# Patient Record
Sex: Female | Born: 1985 | Hispanic: No | Marital: Married | Smoking: Never smoker
Health system: Southern US, Community
[De-identification: ages and names within clinical notes are randomized; demographics above are authoritative.]

## PROBLEM LIST (undated history)

## (undated) DIAGNOSIS — Z9889 Other specified postprocedural states: Secondary | ICD-10-CM

## (undated) DIAGNOSIS — Z3480 Encounter for supervision of other normal pregnancy, unspecified trimester: Secondary | ICD-10-CM

## (undated) HISTORY — PX: NO PAST SURGERIES: SHX2092

---

## 2009-08-29 ENCOUNTER — Inpatient Hospital Stay (HOSPITAL_COMMUNITY): Admission: AD | Admit: 2009-08-29 | Discharge: 2009-09-01 | Payer: Self-pay | Admitting: Obstetrics

## 2010-06-01 LAB — COMPREHENSIVE METABOLIC PANEL
Albumin: 2.9 g/dL — ABNORMAL LOW (ref 3.5–5.2)
Alkaline Phosphatase: 97 U/L (ref 39–117)
BUN: 11 mg/dL (ref 6–23)
Calcium: 9.2 mg/dL (ref 8.4–10.5)
Creatinine, Ser: 0.42 mg/dL (ref 0.4–1.2)
Glucose, Bld: 92 mg/dL (ref 70–99)
Potassium: 4.1 mEq/L (ref 3.5–5.1)
Total Protein: 6.6 g/dL (ref 6.0–8.3)

## 2010-06-01 LAB — CBC
HCT: 21.8 % — ABNORMAL LOW (ref 36.0–46.0)
HCT: 28.5 % — ABNORMAL LOW (ref 36.0–46.0)
Hemoglobin: 7.4 g/dL — ABNORMAL LOW (ref 12.0–15.0)
MCHC: 33.2 g/dL (ref 30.0–36.0)
MCV: 80.9 fL (ref 78.0–100.0)
Platelets: 176 10*3/uL (ref 150–400)
RBC: 2.69 MIL/uL — ABNORMAL LOW (ref 3.87–5.11)
RDW: 17 % — ABNORMAL HIGH (ref 11.5–15.5)
WBC: 19.9 10*3/uL — ABNORMAL HIGH (ref 4.0–10.5)

## 2010-06-01 LAB — RPR: RPR Ser Ql: NONREACTIVE

## 2011-03-17 NOTE — L&D Delivery Note (Signed)
Delivery Note At 10:51 PM a viable female was delivered via Vaginal, Spontaneous Delivery (Presentation: OA; LOT  ).  APGAR: 8,8 ; weight P.   Placenta status:delivered, intact .  Cord:  with the following complications: none .    Anesthesia:  epidural Episiotomy: none Lacerations: 2nd degree perineal Suture Repair: 3.0 vicryl rapide Est. Blood Loss (mL): 500  Mom to postpartum.  Baby to nursery-stable.  BOVARD,Sly Parlee 07/02/2011, 11:13 PM   Br/ O+/ Contra?

## 2011-03-23 LAB — GC/CHLAMYDIA PROBE AMP, GENITAL
Chlamydia: NEGATIVE
Gonorrhea: NEGATIVE

## 2011-03-23 LAB — ABO/RH: RH Type: POSITIVE

## 2011-03-23 LAB — RUBELLA ANTIBODY, IGM: Rubella: IMMUNE

## 2011-04-02 ENCOUNTER — Other Ambulatory Visit: Payer: Self-pay

## 2011-04-03 ENCOUNTER — Other Ambulatory Visit (HOSPITAL_COMMUNITY): Payer: Self-pay | Admitting: Obstetrics and Gynecology

## 2011-04-03 DIAGNOSIS — O0930 Supervision of pregnancy with insufficient antenatal care, unspecified trimester: Secondary | ICD-10-CM

## 2011-04-29 ENCOUNTER — Encounter (HOSPITAL_COMMUNITY): Payer: Self-pay

## 2011-04-29 ENCOUNTER — Ambulatory Visit (HOSPITAL_COMMUNITY)
Admission: RE | Admit: 2011-04-29 | Discharge: 2011-04-29 | Disposition: A | Discharge status: Home / Self Care | Payer: Medicaid Other | Source: Ambulatory Visit | Attending: Obstetrics and Gynecology | Admitting: Obstetrics and Gynecology

## 2011-04-29 DIAGNOSIS — O358XX0 Maternal care for other (suspected) fetal abnormality and damage, not applicable or unspecified: Secondary | ICD-10-CM | POA: Insufficient documentation

## 2011-04-29 DIAGNOSIS — O0930 Supervision of pregnancy with insufficient antenatal care, unspecified trimester: Secondary | ICD-10-CM | POA: Insufficient documentation

## 2011-04-29 DIAGNOSIS — Z363 Encounter for antenatal screening for malformations: Secondary | ICD-10-CM | POA: Insufficient documentation

## 2011-04-29 DIAGNOSIS — Z1389 Encounter for screening for other disorder: Secondary | ICD-10-CM | POA: Insufficient documentation

## 2011-06-01 LAB — STREP B DNA PROBE: GBS: NEGATIVE

## 2011-07-01 ENCOUNTER — Encounter (HOSPITAL_COMMUNITY): Payer: Self-pay

## 2011-07-01 ENCOUNTER — Encounter (HOSPITAL_COMMUNITY): Payer: Self-pay | Admitting: *Deleted

## 2011-07-01 ENCOUNTER — Telehealth (HOSPITAL_COMMUNITY): Payer: Self-pay | Admitting: *Deleted

## 2011-07-01 DIAGNOSIS — Z3480 Encounter for supervision of other normal pregnancy, unspecified trimester: Secondary | ICD-10-CM

## 2011-07-01 HISTORY — DX: Encounter for supervision of other normal pregnancy, unspecified trimester: Z34.80

## 2011-07-01 NOTE — Telephone Encounter (Signed)
Preadmission screen  

## 2011-07-01 NOTE — H&P (Signed)
Natalie [name] is a 26 y.o. female G2P1001 at 33+ for iol given postdates.  +FM, no LOF, no VB, occ ctx; pregnancy complicated by late entry to care.  D/w pt r/b/a of iol Maternal Medical History:  Fetal activity: Perceived fetal activity is normal.      OB History    Grav Para Term Preterm Abortions TAB SAB Ect Mult Living   2 1 1       1     G1 7#1, female 08/2009; G2 present; no abn pap, no STDs Past Medical History  Diagnosis Date  . No pertinent past medical history   . Normal pregnancy, repeat 07/01/2011   Past Surgical History  Procedure Date  . No past surgeries    Family History: denied Social History:  reports that she has never smoked. She has never used smokeless tobacco. She reports that she does not drink alcohol or use illicit drugs.married Meds PNV All NKDA  Review of Systems  Constitutional: Negative.   HENT: Negative.   Eyes: Negative.   Respiratory: Negative.   Cardiovascular: Negative.   Gastrointestinal: Negative.   Genitourinary: Negative.   Musculoskeletal: Negative.   Skin: Negative.   Neurological: Negative.   Endo/Heme/Allergies: Negative.   Psychiatric/Behavioral: Negative.       Last menstrual period 09/21/2010. Maternal Exam:  Uterine Assessment: Contraction strength is moderate.  Contraction frequency is irregular.   Abdomen: Fundal height is appropriate for gestation.   Estimated fetal weight is 6-7#.   Fetal presentation: vertex     Physical Exam  Constitutional: She is oriented to person, place, and time. She appears well-developed and well-nourished.  HENT:  Head: Normocephalic and atraumatic.  Neck: Normal range of motion. Neck supple. No thyromegaly present.  Cardiovascular: Normal rate and regular rhythm.   Respiratory: Effort normal and breath sounds normal.  GI: Soft. Bowel sounds are normal. There is no tenderness.  Musculoskeletal: Normal range of motion.  Neurological: She is alert and oriented to person, place, and  time.  Skin: Skin is warm and dry.  Psychiatric: She has a normal mood and affect. Her behavior is normal.    Prenatal labs: ABO, Rh: O/Positive/-- (01/07 0000) Antibody: Negative (01/07 0000) Rubella: Immune (01/07 0000) RPR: Nonreactive (01/07 0000)  HBsAg: Negative (01/07 0000)  HIV: Non-reactive (01/07 0000)  GBS: Negative (03/18 0000)  Hgb 11.0/ Pap WNL/ Plt 174K/ GC neg/ Chl neg/ CF neg/ glucola 85   [place] good growth, anl anat, somewhat limited, ant plac, female F/u nl anat Assessment/Plan: 26yo G2P1001 for iol given postdates Cytotec/pitocin/ AROM Epidural and IV pain emds Expect SVD   Natalie [name] 07/01/2011, 5:38 PM

## 2011-07-02 ENCOUNTER — Encounter (HOSPITAL_COMMUNITY): Payer: Self-pay | Admitting: Anesthesiology

## 2011-07-02 ENCOUNTER — Encounter (HOSPITAL_COMMUNITY): Payer: Self-pay

## 2011-07-02 ENCOUNTER — Inpatient Hospital Stay (HOSPITAL_COMMUNITY)
Admission: RE | Admit: 2011-07-02 | Discharge: 2011-07-04 | DRG: 775 | Disposition: A | Discharge status: Home / Self Care | Payer: Medicaid Other | Source: Ambulatory Visit | Attending: Obstetrics and Gynecology | Admitting: Obstetrics and Gynecology

## 2011-07-02 ENCOUNTER — Inpatient Hospital Stay (HOSPITAL_COMMUNITY): Payer: Medicaid Other | Admitting: Anesthesiology

## 2011-07-02 VITALS — BP 90/57 | HR 75 | Temp 97.6°F | Resp 18 | Ht 63.0 in | Wt 140.0 lb

## 2011-07-02 DIAGNOSIS — O480 Post-term pregnancy: Principal | ICD-10-CM | POA: Diagnosis present

## 2011-07-02 DIAGNOSIS — Z3480 Encounter for supervision of other normal pregnancy, unspecified trimester: Secondary | ICD-10-CM

## 2011-07-02 HISTORY — DX: Encounter for supervision of other normal pregnancy, unspecified trimester: Z34.80

## 2011-07-02 LAB — CBC
Hemoglobin: 9.7 g/dL — ABNORMAL LOW (ref 12.0–15.0)
MCH: 26.9 pg (ref 26.0–34.0)
MCHC: 31.9 g/dL (ref 30.0–36.0)
MCV: 84.4 fL (ref 78.0–100.0)
Platelets: 179 10*3/uL (ref 150–400)
RBC: 3.6 MIL/uL — ABNORMAL LOW (ref 3.87–5.11)

## 2011-07-02 MED ORDER — LIDOCAINE HCL (PF) 1 % IJ SOLN
30.0000 mL | INTRAMUSCULAR | Status: DC | PRN
  Administered 2011-07-02: 30 mL via SUBCUTANEOUS
  Filled 2011-07-02: qty 30

## 2011-07-02 MED ORDER — LACTATED RINGERS IV SOLN
500.0000 mL | Freq: Once | INTRAVENOUS | Status: AC
  Administered 2011-07-02: 1000 mL via INTRAVENOUS

## 2011-07-02 MED ORDER — IBUPROFEN 600 MG PO TABS: 600.0000 mg | ORAL_TABLET | Freq: Four times a day (QID) | ORAL | Status: DC | PRN

## 2011-07-02 MED ORDER — LIDOCAINE HCL (PF) 1 % IJ SOLN
INTRAMUSCULAR | Status: DC | PRN
  Administered 2011-07-02: 8 mL
  Administered 2011-07-02: 888 mL

## 2011-07-02 MED ORDER — DIPHENHYDRAMINE HCL 50 MG/ML IJ SOLN: 12.5000 mg | INTRAMUSCULAR | Status: DC | PRN

## 2011-07-02 MED ORDER — BUTORPHANOL TARTRATE 2 MG/ML IJ SOLN
2.0000 mg | INTRAMUSCULAR | Status: DC | PRN
  Filled 2011-07-02: qty 1

## 2011-07-02 MED ORDER — FLEET ENEMA 7-19 GM/118ML RE ENEM: 1.0000 | ENEMA | RECTAL | Status: DC | PRN

## 2011-07-02 MED ORDER — ONDANSETRON HCL 4 MG/2ML IJ SOLN: 4.0000 mg | Freq: Four times a day (QID) | INTRAMUSCULAR | Status: DC | PRN

## 2011-07-02 MED ORDER — OXYTOCIN BOLUS FROM INFUSION
500.0000 mL | Freq: Once | INTRAVENOUS | Status: DC
  Filled 2011-07-02: qty 500
  Filled 2011-07-02: qty 1000

## 2011-07-02 MED ORDER — ACETAMINOPHEN 325 MG PO TABS: 650.0000 mg | ORAL_TABLET | ORAL | Status: DC | PRN

## 2011-07-02 MED ORDER — LACTATED RINGERS IV SOLN: 500.0000 mL | INTRAVENOUS | Status: DC | PRN

## 2011-07-02 MED ORDER — LIDOCAINE-EPINEPHRINE 1.5-1:200000 % IJ SOLN
INTRAMUSCULAR | Status: DC | PRN
  Administered 2011-07-02: 10 mL via INTRADERMAL

## 2011-07-02 MED ORDER — TERBUTALINE SULFATE 1 MG/ML IJ SOLN: 0.2500 mg | Freq: Once | INTRAMUSCULAR | Status: DC | PRN

## 2011-07-02 MED ORDER — OXYCODONE-ACETAMINOPHEN 5-325 MG PO TABS: 1.0000 | ORAL_TABLET | ORAL | Status: DC | PRN

## 2011-07-02 MED ORDER — FENTANYL 2.5 MCG/ML BUPIVACAINE 1/10 % EPIDURAL INFUSION (WH - ANES)
INTRAMUSCULAR | Status: DC | PRN
  Administered 2011-07-02: 14 mL/h via EPIDURAL

## 2011-07-02 MED ORDER — FENTANYL 2.5 MCG/ML BUPIVACAINE 1/10 % EPIDURAL INFUSION (WH - ANES)
14.0000 mL/h | INTRAMUSCULAR | Status: DC
  Administered 2011-07-02: 14 mL/h via EPIDURAL
  Filled 2011-07-02 (×2): qty 60

## 2011-07-02 MED ORDER — PHENYLEPHRINE 40 MCG/ML (10ML) SYRINGE FOR IV PUSH (FOR BLOOD PRESSURE SUPPORT)
80.0000 ug | PREFILLED_SYRINGE | INTRAVENOUS | Status: DC | PRN
  Filled 2011-07-02 (×2): qty 5

## 2011-07-02 MED ORDER — LACTATED RINGERS IV SOLN
INTRAVENOUS | Status: DC
  Administered 2011-07-02: 08:00:00 via INTRAVENOUS
  Administered 2011-07-02 (×2): 999 mL/h via INTRAVENOUS

## 2011-07-02 MED ORDER — EPHEDRINE 5 MG/ML INJ: 10.0000 mg | INTRAVENOUS | Status: DC | PRN

## 2011-07-02 MED ORDER — OXYTOCIN 20 UNITS IN LACTATED RINGERS INFUSION - SIMPLE
1.0000 m[IU]/min | INTRAVENOUS | Status: DC
  Administered 2011-07-02: 999 m[IU]/min via INTRAVENOUS
  Administered 2011-07-02: 2 m[IU]/min via INTRAVENOUS

## 2011-07-02 MED ORDER — SODIUM BICARBONATE 8.4 % IV SOLN
INTRAVENOUS | Status: DC | PRN
  Administered 2011-07-02: 5 mL via EPIDURAL

## 2011-07-02 MED ORDER — PHENYLEPHRINE 40 MCG/ML (10ML) SYRINGE FOR IV PUSH (FOR BLOOD PRESSURE SUPPORT): 80.0000 ug | PREFILLED_SYRINGE | INTRAVENOUS | Status: DC | PRN

## 2011-07-02 MED ORDER — MISOPROSTOL 25 MCG QUARTER TABLET
25.0000 ug | ORAL_TABLET | ORAL | Status: DC | PRN
  Administered 2011-07-02: 25 ug via VAGINAL
  Filled 2011-07-02: qty 0.25

## 2011-07-02 MED ORDER — OXYTOCIN 20 UNITS IN LACTATED RINGERS INFUSION - SIMPLE: 125.0000 mL/h | Freq: Once | INTRAVENOUS | Status: DC

## 2011-07-02 MED ORDER — TERBUTALINE SULFATE 1 MG/ML IJ SOLN: 0.2500 mg | Freq: Once | INTRAMUSCULAR | Status: AC | PRN

## 2011-07-02 MED ORDER — EPHEDRINE 5 MG/ML INJ
10.0000 mg | INTRAVENOUS | Status: DC | PRN
  Administered 2011-07-02: 10 mg via INTRAVENOUS
  Filled 2011-07-02 (×2): qty 4

## 2011-07-02 MED ORDER — CITRIC ACID-SODIUM CITRATE 334-500 MG/5ML PO SOLN: 30.0000 mL | ORAL | Status: DC | PRN

## 2011-07-02 NOTE — Progress Notes (Signed)
Traniece Boffa is a 26 y.o. G2P1001 at [redacted]w[redacted]d  admitted for induction of labor due to Post dates. Due date 06/28/11.  Subjective: No c/o's, +FM, no LOF, no VB, occ ctx  Objective: BP 105/68  Pulse 75  Temp 98.3 F (36.8 C)  Resp 18  Ht 5\' 3"  (1.6 m)  Wt 63.504 kg (140 lb)  BMI 24.80 kg/m2  LMP 09/21/2010      FHT:  FHR: 140 bpm, variability: moderate,  accelerations:  Present,  decelerations:  Absent UC:   regular, every 2-4 minutes SVE:   Dilation: Fingertip Effacement (%): 50 Station: -2 Exam by:: apannell  Labs: Lab Results  Component Value Date   WBC 13.0* 07/02/2011   HGB 9.7* 07/02/2011   HCT 30.4* 07/02/2011   MCV 84.4 07/02/2011   PLT 179 07/02/2011    Assessment / Plan: Induction of labor due to postterm,  Ripening with cytotec  Labor: Progressing normally Preeclampsia:  no signs or symptoms of toxicity Fetal Wellbeing:  Category I Pain Control:  Epidural and IV pain meds prn I/D:  n/a Anticipated MOD:  NSVD  BOVARD,Inna Tisdell 07/02/2011, 9:07 AM

## 2011-07-02 NOTE — Progress Notes (Signed)
Natalie [name] is a 26 y.o. G2P1001 at [redacted]w[redacted]d admitted for induction of labor due to Post dates.   Subjective: comf with epidural  Objective: BP 97/48  Pulse 96  Temp(Src) 98.6 F (37 C) (Oral)  Resp 18  Ht 5\' 3"  (1.6 m)  Wt 63.504 kg (140 lb)  BMI 24.80 kg/m2  SpO2 100%  LMP 09/21/2010      FHT:  FHR: 150 bpm, variability: moderate,  accelerations:  Present,  decelerations:  Absent UC:   regular, every 2-3 minutes SVE:   Dilation: 3 Effacement (%): 50 Station: -2 Exam by:: Dr. Ellyn Hack  Labs: Lab Results  Component Value Date   WBC 13.0* 07/02/2011   HGB 9.7* 07/02/2011   HCT 30.4* 07/02/2011   MCV 84.4 07/02/2011   PLT 179 07/02/2011    Assessment / Plan: Induction of labor due to postterm,  progressing well on pitocin  Labor: Progressing on Pitocin, will continue to increase.  AROM performed without difficulty or complication for clear fluid Preeclampsia:  no signs or symptoms of toxicity Fetal Wellbeing:  Category I Pain Control:  Epidural I/D:  n/a Anticipated MOD:  NSVD  BOVARD,Airen Dales 07/02/2011, 5:32 PM

## 2011-07-02 NOTE — Anesthesia Procedure Notes (Addendum)
Epidural Patient location during procedure: OB Start time: 07/02/2011 4:37 PM End time: 07/02/2011 4:42 PM Reason for block: procedure for pain  Staffing Anesthesiologist: Sandrea Hughs  Preanesthetic Checklist Completed: patient identified, site marked, surgical consent, pre-op evaluation, timeout performed, IV checked, risks and benefits discussed and monitors and equipment checked  Epidural Patient position: sitting Prep: site prepped and draped and DuraPrep Patient monitoring: continuous pulse ox and blood pressure Approach: midline Injection technique: LOR air  Needle:  Needle type: Tuohy  Needle gauge: 17 G Needle length: 9 cm Needle insertion depth: 5 cm cm Catheter type: closed end flexible Catheter size: 19 Gauge Catheter at skin depth: 10 cm Test dose: negative and Other  Assessment Sensory level: T8 Events: blood not aspirated, injection not painful, no injection resistance, negative IV test and no paresthesia  Epidural Patient location during procedure: OB  Preanesthetic Checklist Completed: patient identified, site marked, surgical consent, pre-op evaluation, timeout performed, IV checked, risks and benefits discussed and monitors and equipment checked  Epidural Patient position: sitting Prep: site prepped and draped and DuraPrep Patient monitoring: continuous pulse ox and blood pressure Approach: midline Injection technique: LOR air  Needle:  Needle type: Tuohy  Needle gauge: 17 G Needle length: 9 cm Needle insertion depth: 5 cm cm Catheter type: closed end flexible Catheter size: 19 Gauge Catheter at skin depth: 10 cm Test dose: negative  Assessment Events: blood not aspirated, injection not painful, no injection resistance, negative IV test and no paresthesia  Additional Notes Dosing of Epidural:  1st dose, through needle ............................................Marland Kitchen epi 1:200K + Xylocaine 40 mg  2nd dose, through catheter,  after waiting 3 minutes...Marland KitchenMarland Kitchenepi 1:200K + Xylocaine 60 mg    ( mg Marcaine are expressed as equivilent  cc's medication removed from the 0.1%Bupiv / fentanyl syringe from L&D pump)  ( 2% Xylo charted as a single dose in Epic Meds for ease of charting; actual dosing was fractionated as above, for saftey's sake)  As each dose occurred, patient was free of IV sx; and patient exhibited no evidence of SA injection.  Patient is more comfortable after epidural dosed. Please see RN's note for documentation of vital signs,and FHR which are stable.

## 2011-07-02 NOTE — Anesthesia Preprocedure Evaluation (Signed)
Anesthesia Evaluation  Patient identified by MRN, date of birth, ID band Patient awake    Reviewed: Allergy & Precautions, H&P , NPO status , Patient's Chart, lab work & pertinent test results  Airway Mallampati: I TM Distance: >3 FB Neck ROM: full    Dental No notable dental hx.    Pulmonary neg pulmonary ROS,  breath sounds clear to auscultation  Pulmonary exam normal       Cardiovascular negative cardio ROS      Neuro/Psych negative neurological ROS  negative psych ROS   GI/Hepatic negative GI ROS, Neg liver ROS,   Endo/Other  negative endocrine ROS  Renal/GU negative Renal ROS  negative genitourinary   Musculoskeletal negative musculoskeletal ROS (+)   Abdominal Normal abdominal exam  (+)   Peds negative pediatric ROS (+)  Hematology negative hematology ROS (+)   Anesthesia Other Findings   Reproductive/Obstetrics (+) Pregnancy                           Anesthesia Physical Anesthesia Plan  ASA: II  Anesthesia Plan: Epidural   Post-op Pain Management:    Induction:   Airway Management Planned:   Additional Equipment:   Intra-op Plan:   Post-operative Plan:   Informed Consent: I have reviewed the patients History and Physical, chart, labs and discussed the procedure including the risks, benefits and alternatives for the proposed anesthesia with the patient or authorized representative who has indicated his/her understanding and acceptance.     Plan Discussed with:   Anesthesia Plan Comments:         Anesthesia Quick Evaluation  

## 2011-07-03 ENCOUNTER — Encounter (HOSPITAL_COMMUNITY): Payer: Self-pay

## 2011-07-03 LAB — CBC
HCT: 27.5 % — ABNORMAL LOW (ref 36.0–46.0)
Hemoglobin: 8.7 g/dL — ABNORMAL LOW (ref 12.0–15.0)
MCH: 26.5 pg (ref 26.0–34.0)
MCHC: 31.6 g/dL (ref 30.0–36.0)
RDW: 15.9 % — ABNORMAL HIGH (ref 11.5–15.5)

## 2011-07-03 LAB — ABO/RH: ABO/RH(D): O POS

## 2011-07-03 MED ORDER — SIMETHICONE 80 MG PO CHEW: 80.0000 mg | CHEWABLE_TABLET | ORAL | Status: DC | PRN

## 2011-07-03 MED ORDER — TETANUS-DIPHTH-ACELL PERTUSSIS 5-2.5-18.5 LF-MCG/0.5 IM SUSP: 0.5000 mL | Freq: Once | INTRAMUSCULAR | Status: DC

## 2011-07-03 MED ORDER — WITCH HAZEL-GLYCERIN EX PADS: 1.0000 "application " | MEDICATED_PAD | CUTANEOUS | Status: DC | PRN

## 2011-07-03 MED ORDER — ZOLPIDEM TARTRATE 5 MG PO TABS: 5.0000 mg | ORAL_TABLET | Freq: Every evening | ORAL | Status: DC | PRN

## 2011-07-03 MED ORDER — ONDANSETRON HCL 4 MG PO TABS: 4.0000 mg | ORAL_TABLET | ORAL | Status: DC | PRN

## 2011-07-03 MED ORDER — LANOLIN HYDROUS EX OINT: TOPICAL_OINTMENT | CUTANEOUS | Status: DC | PRN

## 2011-07-03 MED ORDER — PRENATAL MULTIVITAMIN CH
1.0000 | ORAL_TABLET | Freq: Every day | ORAL | Status: DC
  Administered 2011-07-03 – 2011-07-04 (×2): 1 via ORAL
  Filled 2011-07-03 (×2): qty 1

## 2011-07-03 MED ORDER — IBUPROFEN 600 MG PO TABS
600.0000 mg | ORAL_TABLET | Freq: Four times a day (QID) | ORAL | Status: DC
  Administered 2011-07-03 – 2011-07-04 (×6): 600 mg via ORAL
  Filled 2011-07-03 (×6): qty 1

## 2011-07-03 MED ORDER — DIBUCAINE 1 % RE OINT: 1.0000 "application " | TOPICAL_OINTMENT | RECTAL | Status: DC | PRN

## 2011-07-03 MED ORDER — ONDANSETRON HCL 4 MG/2ML IJ SOLN: 4.0000 mg | INTRAMUSCULAR | Status: DC | PRN

## 2011-07-03 MED ORDER — BENZOCAINE-MENTHOL 20-0.5 % EX AERO
1.0000 "application " | INHALATION_SPRAY | CUTANEOUS | Status: DC | PRN
  Administered 2011-07-03: 1 via TOPICAL

## 2011-07-03 MED ORDER — OXYCODONE-ACETAMINOPHEN 5-325 MG PO TABS
1.0000 | ORAL_TABLET | ORAL | Status: DC | PRN
  Administered 2011-07-04: 1 via ORAL
  Filled 2011-07-03: qty 1

## 2011-07-03 MED ORDER — BENZOCAINE-MENTHOL 20-0.5 % EX AERO
INHALATION_SPRAY | CUTANEOUS | Status: AC
  Administered 2011-07-03: 1 via TOPICAL
  Filled 2011-07-03: qty 56

## 2011-07-03 MED ORDER — LACTATED RINGERS IV SOLN: INTRAVENOUS | Status: DC

## 2011-07-03 MED ORDER — DIPHENHYDRAMINE HCL 25 MG PO CAPS: 25.0000 mg | ORAL_CAPSULE | Freq: Four times a day (QID) | ORAL | Status: DC | PRN

## 2011-07-03 MED ORDER — SENNOSIDES-DOCUSATE SODIUM 8.6-50 MG PO TABS
2.0000 | ORAL_TABLET | Freq: Every day | ORAL | Status: DC
  Administered 2011-07-03: 2 via ORAL

## 2011-07-03 NOTE — Progress Notes (Signed)
UR chart review completed.  

## 2011-07-03 NOTE — Anesthesia Postprocedure Evaluation (Signed)
  Anesthesia Post-op Note  Patient: Natalie [name]  Procedure(s) Performed: * No procedures listed *  Patient Location: PACU and Mother/Baby  Anesthesia Type: Epidural  Level of Consciousness: awake, alert  and oriented  Airway and Oxygen Therapy: Patient Spontanous Breathing  Post-op Pain: mild  Post-op Assessment: Post-op Vital signs reviewed  Post-op Vital Signs: Reviewed and stable  Complications: No apparent anesthesia complications

## 2011-07-03 NOTE — Progress Notes (Signed)
Post Partum Day 1 Subjective: up ad lib, tolerating PO and nl lochia, pain controlled  Objective: Blood pressure 104/65, pulse 75, temperature 97.6 F (36.4 C), temperature source Oral, resp. rate 16, height 5\' 3"  (1.6 m), weight 63.504 kg (140 lb), last menstrual period 09/21/2010, SpO2 98.00%, unknown if currently breastfeeding.  Physical Exam:  General: alert and no distress Lochia: appropriate Uterine Fundus: firm   Basename 07/03/11 0550 07/02/11 0815  HGB 8.7* 9.7*  HCT 27.5* 30.4*    Assessment/Plan: Plan for discharge tomorrow   LOS: 1 day   Natalie,Natalie Natalie 07/03/2011, 8:39 AM

## 2011-07-04 ENCOUNTER — Encounter (HOSPITAL_COMMUNITY): Payer: Self-pay

## 2011-07-04 MED ORDER — IBUPROFEN 600 MG PO TABS: 600.0000 mg | ORAL_TABLET | Freq: Four times a day (QID) | ORAL | Status: AC

## 2011-07-04 MED ORDER — OXYCODONE-ACETAMINOPHEN 5-325 MG PO TABS: 1.0000 | ORAL_TABLET | ORAL | Status: AC | PRN

## 2011-07-04 NOTE — Discharge Summary (Signed)
Obstetric Discharge Summary Reason for Admission: induction of labor Prenatal Procedures: none Intrapartum Procedures: spontaneous vaginal delivery Postpartum Procedures: none Complications-Operative and Postpartum: second degree perineal laceration Hemoglobin  Date Value Range Status  07/03/2011 8.7* 12.0-15.0 (g/dL) Final     HCT  Date Value Range Status  07/03/2011 27.5* 36.0-46.0 (%) Final    Physical Exam:  General: alert Lochia: appropriate Uterine Fundus: firm    Discharge Diagnoses: Term Pregnancy-delivered  Discharge Information: Date: 07/04/2011 Activity: pelvic rest Diet: routine Medications: Ibuprofen and Percocet Condition: improved Instructions: refer to practice specific booklet Discharge to: home Follow-up Information    Follow up with BOVARD,JODY, MD. Schedule an appointment as soon as possible for a visit in 6 weeks.   Contact information:   [address] [place] [address] [phone]          Newborn Data: Live born female  Birth Weight: 8 lb 4.6 oz (3760 g) APGAR: 8, 8  Home with mother.  Oliver Pila 07/04/2011, 10:51 AM

## 2011-07-04 NOTE — Progress Notes (Signed)
Post Partum Day 2  Subjective: no complaints, up ad lib and tolerating PO  Objective: Blood pressure 90/57, pulse 75, temperature 97.6 F (36.4 C), temperature source Oral, resp. rate 18, height 5\' 3"  (1.6 m), weight 63.504 kg (140 lb), last menstrual period 09/21/2010, SpO2 98.00%, unknown if currently breastfeeding.  Physical Exam:  General: alert Lochia: appropriate Uterine Fundus: firm    Basename 07/03/11 0550 07/02/11 0815  HGB 8.7* 9.7*  HCT 27.5* 30.4*    Assessment/Plan: Discharge home Motrin, percocet F/u 6 weeks in office    LOS: 2 days   Anjanette Gilkey W 07/04/2011, 10:46 AM

## 2014-01-15 ENCOUNTER — Encounter (HOSPITAL_COMMUNITY): Payer: Self-pay

## 2015-01-03 NOTE — Patient Instructions (Signed)
Your procedure is scheduled on:  Monday, January 07, 2015  [facility]: 11:00 A.M.  Pick up the phone at the desk and dial [phone].  Call this number if you have problems the morning of surgery: [phone].  Remember: Do NOT eat food:  After midnight Sunday Do NOT drink clear liquids after:  8:30 am morning of surgery Take these medicines the morning of surgery with a SIP OF WATER: none  Do NOT wear jewelry (body piercing), metal hair clips/bobby pins, make-up, or nail polish. Do NOT wear lotions, powders, or perfumes.  You may wear deoderant. Do NOT shave for 48 hours prior to surgery. Do NOT bring valuables to the hospital. Contacts, dentures, or bridgework may not be worn into surgery. Have a responsible adult drive you home and stay with you for 24 hours after your procedure

## 2015-01-04 ENCOUNTER — Encounter (HOSPITAL_COMMUNITY)
Admission: RE | Admit: 2015-01-04 | Discharge: 2015-01-04 | Disposition: A | Discharge status: Home / Self Care | Payer: Managed Care, Other (non HMO) | Source: Ambulatory Visit | Attending: Obstetrics and Gynecology | Admitting: Obstetrics and Gynecology

## 2015-01-04 ENCOUNTER — Encounter (HOSPITAL_COMMUNITY): Payer: Self-pay

## 2015-01-04 DIAGNOSIS — T8339XA Other mechanical complication of intrauterine contraceptive device, initial encounter: Secondary | ICD-10-CM | POA: Diagnosis present

## 2015-01-04 LAB — CBC
HEMATOCRIT: 33.5 % — AB (ref 36.0–46.0)
HEMOGLOBIN: 11.2 g/dL — AB (ref 12.0–15.0)
MCH: 30.1 pg (ref 26.0–34.0)
MCHC: 33.4 g/dL (ref 30.0–36.0)
MCV: 90.1 fL (ref 78.0–100.0)
Platelets: 189 10*3/uL (ref 150–400)
RBC: 3.72 MIL/uL — AB (ref 3.87–5.11)
RDW: 12.8 % (ref 11.5–15.5)
WBC: 7.4 10*3/uL (ref 4.0–10.5)

## 2015-01-06 NOTE — H&P (Signed)
Natalie [name] is an 29 y.o. female G2P2002 with UD placed 08/2011.  Attempted removal in office 2016.  IUD is embedded in uterine wall, going to OR to remove. D/W pt and husband r/b/a of removal and hysteroscopy.    Pertinent Gynecological History: Sexually transmitted diseases: no past history Previous GYN Procedures: N/A  Last pap: normal OB History: G2, P2002 S/p SVD x 2, Female x 2; 7#1-8#   Menstrual History No LMP recorded.    Past Medical History  Diagnosis Date  . No pertinent past medical history   . Normal pregnancy, repeat 07/01/2011  . SVD (spontaneous vaginal delivery) 07/02/2011    Past Surgical History  Procedure Laterality Date  . 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   HENT: Negative.   Eyes: Negative.   Respiratory: Negative.   Cardiovascular: Negative.   Gastrointestinal: Negative.   Genitourinary: Negative.   Musculoskeletal: Negative.   Skin: Negative.   Neurological: Negative.   Psychiatric/Behavioral: Negative.     unknown if currently breastfeeding. Physical Exam  Constitutional: She is oriented to person, place, and time. She appears well-developed and well-nourished.  HENT:  Head: Normocephalic and atraumatic.  Cardiovascular: Normal rate and regular rhythm.   Respiratory: Effort normal and breath sounds normal. No respiratory distress. She has no wheezes.  GI: Soft. Bowel sounds are normal. She exhibits no distension. There is no tenderness.  Musculoskeletal: Normal range of motion.  Neurological: She is alert and oriented to person, place, and time.  Skin: Skin is warm and dry.  Psychiatric: She has a normal mood and affect. Her behavior is normal.   Assessment/Plan: 29yo [id number] for Mirena removal D/w pt r/b/a of hysteroscopy/IUD removal Wants to conceive in  6-8weeks  Bovard-Stuckert, Annleigh Knueppel 01/06/2015, 8:54 PM

## 2015-01-07 ENCOUNTER — Ambulatory Visit (HOSPITAL_COMMUNITY): Payer: Managed Care, Other (non HMO) | Admitting: Anesthesiology

## 2015-01-07 ENCOUNTER — Encounter (HOSPITAL_COMMUNITY)
Admission: RE | Disposition: A | Discharge status: Home / Self Care | Payer: Self-pay | Source: Ambulatory Visit | Attending: Obstetrics and Gynecology

## 2015-01-07 ENCOUNTER — Ambulatory Visit (HOSPITAL_COMMUNITY)
Admission: RE | Admit: 2015-01-07 | Discharge: 2015-01-07 | Disposition: A | Discharge status: Home / Self Care | Payer: Managed Care, Other (non HMO) | Source: Ambulatory Visit | Attending: Obstetrics and Gynecology | Admitting: Obstetrics and Gynecology

## 2015-01-07 DIAGNOSIS — T8339XA Other mechanical complication of intrauterine contraceptive device, initial encounter: Secondary | ICD-10-CM | POA: Diagnosis not present

## 2015-01-07 HISTORY — PX: HYSTEROSCOPY: SHX211

## 2015-01-07 LAB — PREGNANCY, URINE: Preg Test, Ur: NEGATIVE

## 2015-01-07 SURGERY — HYSTEROSCOPY
Anesthesia: General

## 2015-01-07 MED ORDER — IBUPROFEN 800 MG PO TABS: 800.0000 mg | ORAL_TABLET | Freq: Three times a day (TID) | ORAL | Status: DC | PRN

## 2015-01-07 MED ORDER — LACTATED RINGERS IV SOLN: INTRAVENOUS | Status: DC

## 2015-01-07 MED ORDER — ONDANSETRON HCL 4 MG/2ML IJ SOLN
INTRAMUSCULAR | Status: AC
  Filled 2015-01-07: qty 2

## 2015-01-07 MED ORDER — NEOSTIGMINE METHYLSULFATE 10 MG/10ML IV SOLN
INTRAVENOUS | Status: AC
  Filled 2015-01-07: qty 1

## 2015-01-07 MED ORDER — LIDOCAINE HCL 1 % IJ SOLN
INTRAMUSCULAR | Status: AC
  Filled 2015-01-07: qty 20

## 2015-01-07 MED ORDER — LIDOCAINE HCL (CARDIAC) 20 MG/ML IV SOLN
INTRAVENOUS | Status: DC | PRN
  Administered 2015-01-07: 35 mg via INTRAVENOUS

## 2015-01-07 MED ORDER — PROPOFOL 10 MG/ML IV BOLUS
INTRAVENOUS | Status: AC
  Filled 2015-01-07: qty 20

## 2015-01-07 MED ORDER — MIDAZOLAM HCL 2 MG/2ML IJ SOLN
INTRAMUSCULAR | Status: AC
  Filled 2015-01-07: qty 2

## 2015-01-07 MED ORDER — GLYCOPYRROLATE 0.2 MG/ML IJ SOLN
INTRAMUSCULAR | Status: AC
  Filled 2015-01-07: qty 2

## 2015-01-07 MED ORDER — KETOROLAC TROMETHAMINE 30 MG/ML IJ SOLN
INTRAMUSCULAR | Status: DC | PRN
  Administered 2015-01-07: 15 mg via INTRAVENOUS

## 2015-01-07 MED ORDER — FENTANYL CITRATE (PF) 100 MCG/2ML IJ SOLN
INTRAMUSCULAR | Status: DC | PRN
  Administered 2015-01-07 (×2): 50 ug via INTRAVENOUS

## 2015-01-07 MED ORDER — SCOPOLAMINE 1 MG/3DAYS TD PT72: 1.0000 | MEDICATED_PATCH | Freq: Once | TRANSDERMAL | Status: DC

## 2015-01-07 MED ORDER — LACTATED RINGERS IV SOLN
INTRAVENOUS | Status: DC
  Administered 2015-01-07 (×2): via INTRAVENOUS

## 2015-01-07 MED ORDER — LIDOCAINE HCL 1 % IJ SOLN
INTRAMUSCULAR | Status: DC | PRN
  Administered 2015-01-07: 10 mL

## 2015-01-07 MED ORDER — LACTATED RINGERS IR SOLN
Status: DC | PRN
  Administered 2015-01-07: 3000 mL

## 2015-01-07 MED ORDER — MIDAZOLAM HCL 2 MG/2ML IJ SOLN
INTRAMUSCULAR | Status: DC | PRN
  Administered 2015-01-07: 1 mg via INTRAVENOUS

## 2015-01-07 MED ORDER — LIDOCAINE HCL (CARDIAC) 20 MG/ML IV SOLN
INTRAVENOUS | Status: AC
  Filled 2015-01-07: qty 5

## 2015-01-07 MED ORDER — DEXAMETHASONE SODIUM PHOSPHATE 4 MG/ML IJ SOLN
INTRAMUSCULAR | Status: DC | PRN
  Administered 2015-01-07: 4 mg via INTRAVENOUS

## 2015-01-07 MED ORDER — DEXAMETHASONE SODIUM PHOSPHATE 4 MG/ML IJ SOLN
INTRAMUSCULAR | Status: AC
  Filled 2015-01-07: qty 1

## 2015-01-07 MED ORDER — ONDANSETRON HCL 4 MG/2ML IJ SOLN
INTRAMUSCULAR | Status: DC | PRN
  Administered 2015-01-07: 4 mg via INTRAVENOUS

## 2015-01-07 MED ORDER — PROPOFOL 10 MG/ML IV BOLUS
INTRAVENOUS | Status: DC | PRN
  Administered 2015-01-07 (×2): 10 mg via INTRAVENOUS
  Administered 2015-01-07: 20 mg via INTRAVENOUS
  Administered 2015-01-07: 10 mg via INTRAVENOUS
  Administered 2015-01-07: 50 mg via INTRAVENOUS
  Administered 2015-01-07 (×6): 10 mg via INTRAVENOUS
  Administered 2015-01-07 (×2): 20 mg via INTRAVENOUS
  Administered 2015-01-07: 10 mg via INTRAVENOUS

## 2015-01-07 MED ORDER — FENTANYL CITRATE (PF) 250 MCG/5ML IJ SOLN
INTRAMUSCULAR | Status: AC
  Filled 2015-01-07: qty 5

## 2015-01-07 MED ORDER — ROCURONIUM BROMIDE 100 MG/10ML IV SOLN
INTRAVENOUS | Status: AC
  Filled 2015-01-07: qty 1

## 2015-01-07 MED ORDER — FENTANYL CITRATE (PF) 100 MCG/2ML IJ SOLN: 25.0000 ug | INTRAMUSCULAR | Status: DC | PRN

## 2015-01-07 MED ORDER — ONDANSETRON HCL 4 MG/2ML IJ SOLN: 4.0000 mg | Freq: Once | INTRAMUSCULAR | Status: DC | PRN

## 2015-01-07 SURGICAL SUPPLY — 21 items
ABLATOR ENDOMETRIAL BIPOLAR (ABLATOR) IMPLANT
CANISTER SUCT 3000ML (MISCELLANEOUS) ×3 IMPLANT
CATH ROBINSON RED A/P 16FR (CATHETERS) ×3 IMPLANT
CLOTH BEACON ORANGE TIMEOUT ST (SAFETY) ×3 IMPLANT
CONTAINER PREFILL 10% NBF 60ML (FORM) IMPLANT
ELECT REM PT RETURN 9FT ADLT (ELECTROSURGICAL)
ELECTRODE REM PT RTRN 9FT ADLT (ELECTROSURGICAL) IMPLANT
GLOVE BIO SURGEON STRL SZ 6.5 (GLOVE) ×2 IMPLANT
GLOVE BIO SURGEONS STRL SZ 6.5 (GLOVE) ×1
GLOVE BIOGEL PI IND STRL 7.0 (GLOVE) ×1 IMPLANT
GLOVE BIOGEL PI INDICATOR 7.0 (GLOVE) ×2
GOWN STRL REUS W/TWL LRG LVL3 (GOWN DISPOSABLE) ×6 IMPLANT
LOOP ANGLED CUTTING 22FR (CUTTING LOOP) IMPLANT
NEEDLE SPNL 22GX3.5 QUINCKE BK (NEEDLE) ×3 IMPLANT
PACK VAGINAL MINOR WOMEN LF (CUSTOM PROCEDURE TRAY) ×3 IMPLANT
PAD OB MATERNITY 4.3X12.25 (PERSONAL CARE ITEMS) ×3 IMPLANT
PAD PREP 24X48 CUFFED NSTRL (MISCELLANEOUS) ×3 IMPLANT
TOWEL OR 17X24 6PK STRL BLUE (TOWEL DISPOSABLE) ×6 IMPLANT
TUBING AQUILEX INFLOW (TUBING) ×3 IMPLANT
TUBING AQUILEX OUTFLOW (TUBING) ×3 IMPLANT
WATER STERILE IRR 1000ML POUR (IV SOLUTION) ×3 IMPLANT

## 2015-01-07 NOTE — Brief Op Note (Signed)
01/07/2015  1:08 PM  PATIENT:  Torra Redcay  29 y.o. female  PRE-OPERATIVE DIAGNOSIS:  Removal of Embedded IUD  POST-OPERATIVE DIAGNOSIS:  Attempted Removal of Embedded IUD, diagnostic hysteroscopy  PROCEDURE:  Procedure(s) with comments: HYSTEROSCOPY DIAGNOSTIC, ATTEMPTED REMOVAL OF iNTRAUTERINE DEVICE (N/A) - MD requests 1hr OR time  SURGEON:  Surgeon(s) and Role:    * Sherian ReinJody Bovard-Stuckert, MD - Primary    * Waynard ReedsKendra Ross, MD - Assisting  ANESTHESIA:   IV sedation  EBL:  Total I/O In: 1200 [I.V.:1200] Out: 70 [Urine:50; Blood:20]  BLOOD ADMINISTERED:none  DRAINS: none   LOCAL MEDICATIONS USED:  LIDOCAINE   SPECIMEN:  No Specimen  DISPOSITION OF SPECIMEN:  N/A  COUNTS:  YES  TOURNIQUET:  * No tourniquets in log *  DICTATION: .Other Dictation: Dictation Number [id number]  PLAN OF CARE: Discharge to home after PACU  PATIENT DISPOSITION:  PACU - hemodynamically stable.   Delay start of Pharmacological VTE agent (>24hrs) due to surgical blood loss or risk of bleeding: not applicable

## 2015-01-07 NOTE — Anesthesia Postprocedure Evaluation (Signed)
  Anesthesia Post-op Note  Patient: Natalie [name]  Procedure(s) Performed: Procedure(s) (LRB): HYSTEROSCOPY DIAGNOSTIC, ATTEMPTED REMOVAL OF iNTRAUTERINE DEVICE (N/A)  Patient Location: PACU  Anesthesia Type: MAC  Level of Consciousness: awake and alert   Airway and Oxygen Therapy: Patient Spontanous Breathing  Post-op Pain: mild  Post-op Assessment: Post-op Vital signs reviewed, Patient's Cardiovascular Status Stable, Respiratory Function Stable, Patent Airway and No signs of Nausea or vomiting  Last Vitals:  Filed Vitals:   01/07/15 1330  BP: 92/54  Pulse: 57  Temp:   Resp: 18    Post-op Vital Signs: stable   Complications: No apparent anesthesia complications

## 2015-01-07 NOTE — Interval H&P Note (Signed)
History and Physical Interval Note:  01/07/2015 12:04 PM  Marcine Struss  has presented today for surgery, with the diagnosis of Removal of Embedded IUD  The various methods of treatment have been discussed with the patient and family. After consideration of risks, benefits and other options for treatment, the patient has consented to  Procedure(s) with comments: HYSTEROSCOPY (N/A) - MD requests 1hr OR time INTRAUTERINE DEVICE (IUD) REMOVAL, EMBEDDED  (N/A) as a surgical intervention .  The patient's history has been reviewed, patient examined, no change in status, stable for surgery.  I have reviewed the patient's chart and labs.  Questions were answered to the patient's satisfaction.     Bovard-Stuckert, Kenika Sahm

## 2015-01-07 NOTE — Transfer of Care (Signed)
Immediate Anesthesia Transfer of Care Note  Patient: Natalie [name]  Procedure(s) Performed: Procedure(s) with comments: HYSTEROSCOPY DIAGNOSTIC, ATTEMPTED REMOVAL OF iNTRAUTERINE DEVICE (N/A) - MD requests 1hr OR time  Patient Location: PACU  Anesthesia Type:MAC combined with regional for post-op pain  Level of Consciousness: awake, alert  and oriented  Airway & Oxygen Therapy: Patient Spontanous Breathing  Post-op Assessment: Report given to RN and Post -op Vital signs reviewed and stable  Post vital signs: Reviewed and stable  Last Vitals:  Filed Vitals:   01/07/15 1330  BP: 92/54  Pulse: 57  Temp:   Resp: 18    Complications: No apparent anesthesia complications

## 2015-01-07 NOTE — Anesthesia Preprocedure Evaluation (Signed)
Anesthesia Evaluation  Patient identified by MRN, date of birth, ID band Patient awake    Reviewed: Allergy & Precautions, NPO status , Patient's Chart, lab work & pertinent test results  History of Anesthesia Complications Negative for: history of anesthetic complications  Airway Mallampati: II  TM Distance: >3 FB Neck ROM: Full    Dental no notable dental hx. (+) Dental Advisory Given   Pulmonary neg pulmonary ROS,    Pulmonary exam normal breath sounds clear to auscultation       Cardiovascular negative cardio ROS Normal cardiovascular exam Rhythm:Regular Rate:Normal     Neuro/Psych negative neurological ROS  negative psych ROS   GI/Hepatic negative GI ROS, Neg liver ROS,   Endo/Other  negative endocrine ROS  Renal/GU negative Renal ROS  negative genitourinary   Musculoskeletal negative musculoskeletal ROS (+)   Abdominal   Peds negative pediatric ROS (+)  Hematology negative hematology ROS (+)   Anesthesia Other Findings   Reproductive/Obstetrics negative OB ROS                             Anesthesia Physical Anesthesia Plan  ASA: I  Anesthesia Plan: General   Post-op Pain Management:    Induction: Intravenous  Airway Management Planned: LMA  Additional Equipment:   Intra-op Plan:   Post-operative Plan: Extubation in OR  Informed Consent: I have reviewed the patients History and Physical, chart, labs and discussed the procedure including the risks, benefits and alternatives for the proposed anesthesia with the patient or authorized representative who has indicated his/her understanding and acceptance.   Dental advisory given  Plan Discussed with: CRNA  Anesthesia Plan Comments:         Anesthesia Quick Evaluation  

## 2015-01-07 NOTE — Discharge Instructions (Signed)
DISCHARGE INSTRUCTIONS: HYSTEROSCOPY / ENDOMETRIAL ABLATION °The following instructions have been prepared to help you care for yourself upon your return home. ° °May Remove Scop patch on or before ° °May take Ibuprofen after ° °May take stool softner while taking narcotic pain medication to prevent constipation.  Drink plenty of water. ° °Personal hygiene: °• Use sanitary pads for vaginal drainage, not tampons. °• Shower the day after your procedure. °• NO tub baths, pools or Jacuzzis for 2-3 weeks. °• Wipe front to back after using the bathroom. ° °Activity and limitations: °• Do NOT drive or operate any equipment for 24 hours. The effects of anesthesia are still present °and drowsiness may result. °• Do NOT rest in bed all day. °• Walking is encouraged. °• Walk up and down stairs slowly. °• You may resume your normal activity in one to two days or as indicated by your physician. °Sexual activity: NO intercourse for at least 2 weeks after the procedure, or as indicated by your °Doctor. ° °Diet: Eat a light meal as desired this evening. You may resume your usual diet tomorrow. ° °Return to Work: You may resume your work activities in one to two days or as indicated by your °Doctor. ° °What to expect after your surgery: Expect to have vaginal bleeding/discharge for 2-3 days and °spotting for up to 10 days. It is not unusual to have soreness for up to 1-2 weeks. You may have a °slight burning sensation when you urinate for the first day. Mild cramps may continue for a couple of °days. You may have a regular period in 2-6 weeks. ° °Call your doctor for any of the following: °• Excessive vaginal bleeding or clotting, saturating and changing one pad every hour. °• Inability to urinate 6 hours after discharge from hospital. °• Pain not relieved by pain medication. °• Fever of 100.4° F or greater. °• Unusual vaginal discharge or odor. ° °Return to office _________________Call for an appointment  ___________________ °Patient’s signature: ______________________ °Nurse’s signature ________________________ ° °Post Anesthesia Care Unit [phone] °

## 2015-01-08 ENCOUNTER — Encounter (HOSPITAL_COMMUNITY): Payer: Self-pay | Admitting: Obstetrics and Gynecology

## 2015-01-08 NOTE — Op Note (Signed)
NAMJess Barters:  Bearce, Sayward             ACCOUNT NO.:  [id number]  MEDICAL RECORD NO.:  [id number]  LOCATION:  [facility]                          FACILITY:  [facility]  PHYSICIAN:  Sherron MondayJody Bovard, MD        DATE OF BIRTH:  08-Apr-1985  DATE OF PROCEDURE:  01/07/2015 DATE OF DISCHARGE:                              OPERATIVE REPORT   PREOPERATIVE DIAGNOSIS:  Embedded intrauterine device.  POSTOPERATIVE DIAGNOSIS:  Diagnostic hysteroscopy, attempted removal of embedded intrauterine device.  PROCEDURE:  Diagnostic hysteroscopy, attempted removal of the intrauterine device.  SURGEON:  Sherron MondayJody Bovard, MD.  ASSISTANFreddrick March:  Kendra H. Tenny Crawoss, MD.  ANESTHESIA:  IV sedation.  A 10 mL of 1% lidocaine was also used as a paracervical block.  EBL:  20 mL.  IV FLUIDS:  1200 mL.  URINE OUTPUT:  50 mL by I and O cath prior to the procedure.  DESCRIPTION OF PROCEDURE:  After informed consent was reviewed with the patient and her husband, she was transported to the OR, placed on the table in supine position, then placed in the [place] stirrups.  IV sedation was induced and found to be adequate.  She was then prepped and draped in the normal sterile fashion.  An open-sided speculum was placed in the vagina.  The cervix was easily visualized.  The IUD strings were unable to be seen.  A 10 mL paracervical block was placed at 12, 4, and 7 o'clock.    Using a Tresa EndoKelly, the IUD was attempted to be grasped and removed.  There was difficulty finding the IUD strings.  Hysteroscopy identified the strings three quarters of the way up the cervix. They were embedded in cervix.  Again, the hysteroscope was removed.  The IUD was attempted to be grasped and removed.  This was unsuccessful.  The strings were shredded.  Attempt at removal was also attempted with a hysteroscopic grasper.  After multiple attempts, Dr. Janae Bridgemanoss ultrasounded the pelvis to try to attempt to identify where the IUD was located.  Again, the hysteroscope was placed in  the vagina.  At this point, the IUD strings were unable to be identified and a small hole approximately the size of an IUD was noted in the cervical mucosa.  The procedure was terminated. The deficit was noted to be 250 mL.  The patient tolerated the procedure well, returned to supine position and awakened in stable condition.     Sherron MondayJody Bovard, MD     JB/MEDQ  D:  01/07/2015  T:  01/07/2015  Job:  [id number]

## 2015-02-15 ENCOUNTER — Encounter (HOSPITAL_COMMUNITY): Payer: Self-pay

## 2015-02-15 ENCOUNTER — Encounter (HOSPITAL_COMMUNITY)
Admission: RE | Admit: 2015-02-15 | Discharge: 2015-02-15 | Disposition: A | Discharge status: Home / Self Care | Payer: Managed Care, Other (non HMO) | Source: Ambulatory Visit | Attending: Obstetrics and Gynecology | Admitting: Obstetrics and Gynecology

## 2015-02-15 DIAGNOSIS — Z01818 Encounter for other preprocedural examination: Secondary | ICD-10-CM | POA: Insufficient documentation

## 2015-02-15 LAB — COMPREHENSIVE METABOLIC PANEL
ALT: 20 U/L (ref 14–54)
AST: 16 U/L (ref 15–41)
Albumin: 4.2 g/dL (ref 3.5–5.0)
Alkaline Phosphatase: 31 U/L — ABNORMAL LOW (ref 38–126)
Anion gap: 4 — ABNORMAL LOW (ref 5–15)
BUN: 14 mg/dL (ref 6–20)
CHLORIDE: 108 mmol/L (ref 101–111)
CO2: 26 mmol/L (ref 22–32)
Calcium: 9 mg/dL (ref 8.9–10.3)
Creatinine, Ser: 0.55 mg/dL (ref 0.44–1.00)
Glucose, Bld: 86 mg/dL (ref 65–99)
POTASSIUM: 3.8 mmol/L (ref 3.5–5.1)
SODIUM: 138 mmol/L (ref 135–145)
Total Bilirubin: 0.8 mg/dL (ref 0.3–1.2)
Total Protein: 8 g/dL (ref 6.5–8.1)

## 2015-02-15 LAB — CBC
HCT: 33.7 % — ABNORMAL LOW (ref 36.0–46.0)
Hemoglobin: 11.3 g/dL — ABNORMAL LOW (ref 12.0–15.0)
MCH: 30.6 pg (ref 26.0–34.0)
MCHC: 33.5 g/dL (ref 30.0–36.0)
MCV: 91.3 fL (ref 78.0–100.0)
PLATELETS: 188 10*3/uL (ref 150–400)
RBC: 3.69 MIL/uL — AB (ref 3.87–5.11)
RDW: 12.7 % (ref 11.5–15.5)
WBC: 8.3 10*3/uL (ref 4.0–10.5)

## 2015-02-15 NOTE — Patient Instructions (Addendum)
   Your procedure is scheduled on: DEC 7 [facility](WEDNESDAY)   [facility] through [facility] at: 6[facility]  Pick up the phone at the desk and dial [phone] and inform us of your arrival.  Please call this number if you have any problems the morning of surgery: [phone]  Remember:  DO NOT EAT OR DRINK AFTER MIDNIGHT DEC 6 (TUESDAY)   Do not wear jewelry, make-up, or FINGER nail polish No metal in your hair or on your body. Do not wear lotions, powders, perfumes.  You may wear deodorant.  Do not bring valuables to the hospital. Contacts, dentures or bridgework may not be worn into surgery.   Patients discharged on the day of surgery will not be allowed to drive home.

## 2015-02-19 ENCOUNTER — Encounter (HOSPITAL_COMMUNITY): Payer: Self-pay | Admitting: Anesthesiology

## 2015-02-19 NOTE — Anesthesia Preprocedure Evaluation (Addendum)
Anesthesia Evaluation  Patient identified by MRN, date of birth, ID band Patient awake    Reviewed: Allergy & Precautions, NPO status , Patient's Chart, lab work & pertinent test results  Airway Mallampati: II   Neck ROM: Full    Dental  (+) Teeth Intact   Pulmonary neg pulmonary ROS,    breath sounds clear to auscultation       Cardiovascular negative cardio ROS   Rhythm:Regular Rate:Normal     Neuro/Psych negative neurological ROS  negative psych ROS   GI/Hepatic negative GI ROS, Neg liver ROS,   Endo/Other  negative endocrine ROS  Renal/GU negative Renal ROS  negative genitourinary   Musculoskeletal negative musculoskeletal ROS (+)   Abdominal   Peds negative pediatric ROS (+)  Hematology negative hematology ROS (+)   Anesthesia Other Findings   Reproductive/Obstetrics negative OB ROS                            Anesthesia Physical Anesthesia Plan  ASA: I  Anesthesia Plan: General   Post-op Pain Management:    Induction: Intravenous  Airway Management Planned: Oral ETT  Additional Equipment:   Intra-op Plan:   Post-operative Plan: Extubation in OR  Informed Consent: I have reviewed the patients History and Physical, chart, labs and discussed the procedure including the risks, benefits and alternatives for the proposed anesthesia with the patient or authorized representative who has indicated his/her understanding and acceptance.   Dental advisory given  Plan Discussed with: CRNA  Anesthesia Plan Comments:         Anesthesia Quick Evaluation

## 2015-02-19 NOTE — H&P (Signed)
Natalie BartersMariem [name] is an 29 y.o. female [phone] with IUD perforation.  IUD attempted to be removed in office, unable to be.  Attempt in OR with hysteroscopy, however IUD perforation noted.US reveals IUD behind uterus.  Pt knows not preventing pregnancy and nontoxic. Prefers removal.  Preventing pregnancy with OC.  D/w pt r/b/a of laparoscopic IUD removal.   Pertinent Gynecological History: Menses: q mo, regular Contraception: OCP Sexually transmitted diseases: none Last pap: 12/11/14 WNL OB History: G2, P2002, SVD x 2 7#1, 8#4 Female x2  Menstrual History: No LMP recorded.    Past Medical History  Diagnosis Date  . No pertinent past medical history   . Normal pregnancy, repeat 07/01/2011  . SVD (spontaneous vaginal delivery) 07/02/2011    Past Surgical History  Procedure Laterality Date  . No past surgeries    . Hysteroscopy N/A 01/07/2015    Procedure: HYSTEROSCOPY DIAGNOSTIC, ATTEMPTED REMOVAL OF iNTRAUTERINE DEVICE;  Surgeon: Sherian ReinJody Bovard-Stuckert, MD;  Location: [facility];  Service: Gynecology;  Laterality: N/A;  MD requests 1hr OR time    FH: noncontributory  Social History:  reports that she has never smoked. She has never used smokeless tobacco. She reports that she does not drink alcohol or use illicit drugs.married, works in childcare  Allergies: No Known Allergies  Meds Ibuprofen, MVI, Lutera    Review of Systems  Constitutional: Negative.   HENT: Negative.   Eyes: Negative.   Respiratory: Negative.   Cardiovascular: Negative.   Gastrointestinal: Positive for abdominal pain.  Genitourinary: Negative.   Musculoskeletal: Negative.   Skin: Negative.   Neurological: Negative.   Psychiatric/Behavioral: Negative.     unknown if currently breastfeeding. Physical Exam  Constitutional: She is oriented to person, place, and time. She appears well-developed and well-nourished.  HENT:  Head: Normocephalic and atraumatic.  Cardiovascular: Normal rate and regular rhythm.    Respiratory: Effort normal and breath sounds normal. No respiratory distress. She has no wheezes.  GI: Soft. Bowel sounds are normal. She exhibits no distension. There is no tenderness.  Musculoskeletal: Normal range of motion.  Neurological: She is alert and oriented to person, place, and time.  Skin: Skin is warm and dry.  Psychiatric: She has a normal mood and affect. Her behavior is normal.   US - reveals IUD behind uterus  Assessment/Plan: 29yo [id number] with uterine perforation by IUD D/w pt r/b/a of laparoscopic removal of IUD  Bovard-Stuckert, Jeston Junkins 02/19/2015, 8:56 PM

## 2015-02-20 ENCOUNTER — Ambulatory Visit (HOSPITAL_COMMUNITY): Payer: Managed Care, Other (non HMO) | Admitting: Anesthesiology

## 2015-02-20 ENCOUNTER — Encounter (HOSPITAL_COMMUNITY): Payer: Self-pay | Admitting: Obstetrics and Gynecology

## 2015-02-20 ENCOUNTER — Ambulatory Visit (HOSPITAL_COMMUNITY)
Admission: RE | Admit: 2015-02-20 | Discharge: 2015-02-20 | Disposition: A | Discharge status: Home / Self Care | Payer: Managed Care, Other (non HMO) | Source: Ambulatory Visit | Attending: Obstetrics and Gynecology | Admitting: Obstetrics and Gynecology

## 2015-02-20 ENCOUNTER — Encounter (HOSPITAL_COMMUNITY)
Admission: RE | Disposition: A | Discharge status: Home / Self Care | Payer: Self-pay | Source: Ambulatory Visit | Attending: Obstetrics and Gynecology

## 2015-02-20 DIAGNOSIS — T8339XA Other mechanical complication of intrauterine contraceptive device, initial encounter: Secondary | ICD-10-CM | POA: Diagnosis not present

## 2015-02-20 DIAGNOSIS — Z9889 Other specified postprocedural states: Secondary | ICD-10-CM

## 2015-02-20 DIAGNOSIS — K3532 Acute appendicitis with perforation and localized peritonitis, without abscess: Secondary | ICD-10-CM

## 2015-02-20 DIAGNOSIS — Z30432 Encounter for removal of intrauterine contraceptive device: Secondary | ICD-10-CM | POA: Insufficient documentation

## 2015-02-20 HISTORY — PX: LAPAROSCOPY: SHX197

## 2015-02-20 HISTORY — DX: Other specified postprocedural states: Z98.890

## 2015-02-20 HISTORY — PX: IUD REMOVAL: SHX5392

## 2015-02-20 LAB — PREGNANCY, URINE: Preg Test, Ur: NEGATIVE

## 2015-02-20 SURGERY — LAPAROSCOPY OPERATIVE
Anesthesia: General | Site: Abdomen

## 2015-02-20 MED ORDER — FENTANYL CITRATE (PF) 100 MCG/2ML IJ SOLN: 25.0000 ug | INTRAMUSCULAR | Status: DC | PRN

## 2015-02-20 MED ORDER — MEPERIDINE HCL 25 MG/ML IJ SOLN: 6.2500 mg | INTRAMUSCULAR | Status: DC | PRN

## 2015-02-20 MED ORDER — MIDAZOLAM HCL 2 MG/2ML IJ SOLN
INTRAMUSCULAR | Status: DC | PRN
  Administered 2015-02-20: 1 mg via INTRAVENOUS

## 2015-02-20 MED ORDER — LACTATED RINGERS IV SOLN
INTRAVENOUS | Status: DC
Start: 2015-02-20 — End: 2015-02-20

## 2015-02-20 MED ORDER — GLYCOPYRROLATE 0.2 MG/ML IJ SOLN
INTRAMUSCULAR | Status: AC
  Filled 2015-02-20: qty 2

## 2015-02-20 MED ORDER — IBUPROFEN 800 MG PO TABS: 800.0000 mg | ORAL_TABLET | Freq: Three times a day (TID) | ORAL | Status: DC | PRN

## 2015-02-20 MED ORDER — LIDOCAINE HCL (CARDIAC) 20 MG/ML IV SOLN
INTRAVENOUS | Status: AC
Start: 2015-02-20 — End: 2015-02-20
  Filled 2015-02-20: qty 5

## 2015-02-20 MED ORDER — SCOPOLAMINE 1 MG/3DAYS TD PT72
MEDICATED_PATCH | TRANSDERMAL | Status: AC
  Administered 2015-02-20: 1.5 mg via TRANSDERMAL
  Filled 2015-02-20: qty 1

## 2015-02-20 MED ORDER — ONDANSETRON HCL 4 MG/2ML IJ SOLN
INTRAMUSCULAR | Status: AC
  Filled 2015-02-20: qty 2

## 2015-02-20 MED ORDER — PHENYLEPHRINE HCL 10 MG/ML IJ SOLN
INTRAMUSCULAR | Status: DC | PRN
  Administered 2015-02-20: 40 ug via INTRAVENOUS

## 2015-02-20 MED ORDER — SODIUM CHLORIDE 0.9 % IJ SOLN
INTRAMUSCULAR | Status: DC | PRN
  Administered 2015-02-20: 10 mL

## 2015-02-20 MED ORDER — MIDAZOLAM HCL 2 MG/2ML IJ SOLN
INTRAMUSCULAR | Status: AC
  Filled 2015-02-20: qty 2

## 2015-02-20 MED ORDER — NEOSTIGMINE METHYLSULFATE 10 MG/10ML IV SOLN
INTRAVENOUS | Status: DC | PRN
  Administered 2015-02-20: 2 mg via INTRAVENOUS

## 2015-02-20 MED ORDER — BUPIVACAINE HCL (PF) 0.25 % IJ SOLN
INTRAMUSCULAR | Status: DC | PRN
  Administered 2015-02-20: 5 mL
  Administered 2015-02-20: 6 mL

## 2015-02-20 MED ORDER — DEXAMETHASONE SODIUM PHOSPHATE 10 MG/ML IJ SOLN
INTRAMUSCULAR | Status: AC
  Filled 2015-02-20: qty 1

## 2015-02-20 MED ORDER — PROMETHAZINE HCL 25 MG/ML IJ SOLN
6.2500 mg | INTRAMUSCULAR | Status: DC | PRN
  Administered 2015-02-20: 6.25 mg via INTRAVENOUS

## 2015-02-20 MED ORDER — LACTATED RINGERS IV SOLN
INTRAVENOUS | Status: DC
  Administered 2015-02-20 (×2): via INTRAVENOUS

## 2015-02-20 MED ORDER — FENTANYL CITRATE (PF) 250 MCG/5ML IJ SOLN
INTRAMUSCULAR | Status: AC
  Filled 2015-02-20: qty 5

## 2015-02-20 MED ORDER — ROCURONIUM BROMIDE 100 MG/10ML IV SOLN
INTRAVENOUS | Status: DC | PRN
  Administered 2015-02-20: 10 mg via INTRAVENOUS
  Administered 2015-02-20 (×2): 5 mg via INTRAVENOUS

## 2015-02-20 MED ORDER — BUPIVACAINE HCL (PF) 0.25 % IJ SOLN
INTRAMUSCULAR | Status: AC
  Filled 2015-02-20: qty 30

## 2015-02-20 MED ORDER — SCOPOLAMINE 1 MG/3DAYS TD PT72
1.0000 | MEDICATED_PATCH | Freq: Once | TRANSDERMAL | Status: DC
  Administered 2015-02-20: 1.5 mg via TRANSDERMAL

## 2015-02-20 MED ORDER — PROMETHAZINE HCL 25 MG/ML IJ SOLN
INTRAMUSCULAR | Status: AC
  Administered 2015-02-20: 6.25 mg via INTRAVENOUS
  Filled 2015-02-20: qty 1

## 2015-02-20 MED ORDER — NEOSTIGMINE METHYLSULFATE 10 MG/10ML IV SOLN
INTRAVENOUS | Status: AC
  Filled 2015-02-20: qty 1

## 2015-02-20 MED ORDER — LACTATED RINGERS IV SOLN: INTRAVENOUS | Status: DC

## 2015-02-20 MED ORDER — ONDANSETRON HCL 4 MG/2ML IJ SOLN
INTRAMUSCULAR | Status: DC | PRN
  Administered 2015-02-20: 4 mg via INTRAVENOUS

## 2015-02-20 MED ORDER — GLYCOPYRROLATE 0.2 MG/ML IJ SOLN
INTRAMUSCULAR | Status: DC | PRN
  Administered 2015-02-20: 0.4 mg via INTRAVENOUS
  Administered 2015-02-20: 0.1 mg via INTRAVENOUS

## 2015-02-20 MED ORDER — ROCURONIUM BROMIDE 100 MG/10ML IV SOLN
INTRAVENOUS | Status: AC
  Filled 2015-02-20: qty 1

## 2015-02-20 MED ORDER — KETOROLAC TROMETHAMINE 30 MG/ML IJ SOLN
INTRAMUSCULAR | Status: AC
  Filled 2015-02-20: qty 1

## 2015-02-20 MED ORDER — FENTANYL CITRATE (PF) 250 MCG/5ML IJ SOLN
INTRAMUSCULAR | Status: DC | PRN
  Administered 2015-02-20 (×2): 100 ug via INTRAVENOUS

## 2015-02-20 MED ORDER — SODIUM CHLORIDE 0.9 % IJ SOLN
INTRAMUSCULAR | Status: AC
  Filled 2015-02-20: qty 10

## 2015-02-20 MED ORDER — OXYCODONE-ACETAMINOPHEN 5-325 MG PO TABS: 1.0000 | ORAL_TABLET | Freq: Four times a day (QID) | ORAL | Status: DC | PRN

## 2015-02-20 MED ORDER — PROPOFOL 10 MG/ML IV BOLUS
INTRAVENOUS | Status: AC
  Filled 2015-02-20: qty 20

## 2015-02-20 MED ORDER — HEPARIN SODIUM (PORCINE) 5000 UNIT/ML IJ SOLN
INTRAMUSCULAR | Status: AC
Start: 2015-02-20 — End: 2015-02-20
  Filled 2015-02-20: qty 1

## 2015-02-20 MED ORDER — LIDOCAINE HCL (CARDIAC) 20 MG/ML IV SOLN
INTRAVENOUS | Status: DC | PRN
  Administered 2015-02-20: 100 mg via INTRAVENOUS

## 2015-02-20 MED ORDER — SUCCINYLCHOLINE CHLORIDE 20 MG/ML IJ SOLN
INTRAMUSCULAR | Status: DC | PRN
  Administered 2015-02-20: 60 mg via INTRAVENOUS

## 2015-02-20 MED ORDER — SUCCINYLCHOLINE CHLORIDE 20 MG/ML IJ SOLN
INTRAMUSCULAR | Status: AC
  Filled 2015-02-20: qty 1

## 2015-02-20 MED ORDER — DEXAMETHASONE SODIUM PHOSPHATE 4 MG/ML IJ SOLN
INTRAMUSCULAR | Status: DC | PRN
  Administered 2015-02-20: 5 mg via INTRAVENOUS

## 2015-02-20 MED ORDER — PROPOFOL 10 MG/ML IV BOLUS
INTRAVENOUS | Status: DC | PRN
Start: 2015-02-20 — End: 2015-02-20
  Administered 2015-02-20: 160 mg via INTRAVENOUS
  Administered 2015-02-20: 40 mg via INTRAVENOUS
  Administered 2015-02-20: 60 mg via INTRAVENOUS

## 2015-02-20 MED ORDER — METHYLENE BLUE 1 % INJ SOLN
INTRAMUSCULAR | Status: AC
Start: 2015-02-20 — End: 2015-02-20
  Filled 2015-02-20: qty 1

## 2015-02-20 MED ORDER — KETOROLAC TROMETHAMINE 30 MG/ML IJ SOLN
INTRAMUSCULAR | Status: DC | PRN
  Administered 2015-02-20: 30 mg via INTRAVENOUS

## 2015-02-20 SURGICAL SUPPLY — 31 items
CABLE HIGH FREQUENCY MONO STRZ (ELECTRODE) IMPLANT
CATH ROBINSON RED A/P 16FR (CATHETERS) ×3 IMPLANT
CHLORAPREP W/TINT 26ML (MISCELLANEOUS) ×3 IMPLANT
CLOTH BEACON ORANGE TIMEOUT ST (SAFETY) ×3 IMPLANT
CONTAINER PREFILL 10% NBF 60ML (FORM) ×6 IMPLANT
DRSG COVADERM PLUS 2X2 (GAUZE/BANDAGES/DRESSINGS) ×3 IMPLANT
DRSG OPSITE POSTOP 3X4 (GAUZE/BANDAGES/DRESSINGS) IMPLANT
GLOVE BIO SURGEON STRL SZ 6.5 (GLOVE) ×2 IMPLANT
GLOVE BIO SURGEONS STRL SZ 6.5 (GLOVE) ×1
GLOVE BIOGEL PI IND STRL 7.0 (GLOVE) ×2 IMPLANT
GLOVE BIOGEL PI INDICATOR 7.0 (GLOVE) ×4
GOWN STRL REUS W/TWL LRG LVL3 (GOWN DISPOSABLE) ×6 IMPLANT
LIQUID BAND (GAUZE/BANDAGES/DRESSINGS) ×3 IMPLANT
NEEDLE INSUFFLATION 120MM (ENDOMECHANICALS) ×3 IMPLANT
NEEDLE SPNL 22GX3.5 QUINCKE BK (NEEDLE) ×3 IMPLANT
NS IRRIG 1000ML POUR BTL (IV SOLUTION) ×3 IMPLANT
PACK LAPAROSCOPY BASIN (CUSTOM PROCEDURE TRAY) ×3 IMPLANT
PACK VAGINAL MINOR WOMEN LF (CUSTOM PROCEDURE TRAY) ×3 IMPLANT
PAD POSITIONING PINK XL (MISCELLANEOUS) ×3 IMPLANT
PAD PREP 24X48 CUFFED NSTRL (MISCELLANEOUS) ×3 IMPLANT
POUCH SPECIMEN RETRIEVAL 10MM (ENDOMECHANICALS) IMPLANT
SET IRRIG TUBING LAPAROSCOPIC (IRRIGATION / IRRIGATOR) IMPLANT
SHEARS HARMONIC ACE PLUS 36CM (ENDOMECHANICALS) IMPLANT
SLEEVE XCEL OPT CAN 5 100 (ENDOMECHANICALS) ×3 IMPLANT
SUT VICRYL 0 UR6 27IN ABS (SUTURE) IMPLANT
SUT VICRYL 4-0 PS2 18IN ABS (SUTURE) ×3 IMPLANT
TOWEL OR 17X24 6PK STRL BLUE (TOWEL DISPOSABLE) ×6 IMPLANT
TRAY FOLEY BAG SILVER LF 14FR (CATHETERS) ×3 IMPLANT
TROCAR XCEL NON-BLD 11X100MML (ENDOMECHANICALS) IMPLANT
WARMER LAPAROSCOPE (MISCELLANEOUS) ×3 IMPLANT
WATER STERILE IRR 1000ML POUR (IV SOLUTION) ×3 IMPLANT

## 2015-02-20 NOTE — Brief Op Note (Signed)
02/20/2015  8:18 AM  PATIENT:  Natalie [name]  29 y.o. female  PRE-OPERATIVE DIAGNOSIS:  Embedded IUD  POST-OPERATIVE DIAGNOSIS:  embedded IUD  PROCEDURE:  Procedure(s): LAPAROSCOPY OPERATIVE (N/A) INTRAUTERINE DEVICE (IUD) REMOVAL (N/A)  SURGEON:  Surgeon(s) and Role:    * Sherian ReinJody Bovard-Stuckert, MD - Primary  ASSISTANTS: Karmen StabsSumner, Tracie RNFA   ANESTHESIA:   local and general  EBL:  Total I/O In: -  Out: 50 [Urine:50] EBL min < 20cc  BLOOD ADMINISTERED:none  DRAINS: none   LOCAL MEDICATIONS USED:  MARCAINE     SPECIMEN:  Source of Specimen:  IUD  DISPOSITION OF SPECIMEN:  disposal  COUNTS:  YES  TOURNIQUET:  * No tourniquets in log *  DICTATION: .Other Dictation: Dictation Number [id number]  PLAN OF CARE: Discharge to home after PACU  PATIENT DISPOSITION:  PACU - hemodynamically stable.   Delay start of Pharmacological VTE agent (>24hrs) due to surgical blood loss or risk of bleeding: not applicable

## 2015-02-20 NOTE — Anesthesia Postprocedure Evaluation (Signed)
Anesthesia Post Note  Patient: Glynis Susman  Procedure(s) Performed: Procedure(s) (LRB): LAPAROSCOPY OPERATIVE (N/A) INTRAUTERINE DEVICE (IUD) REMOVAL (N/A)  Patient location during evaluation: PACU Anesthesia Type: General Level of consciousness: awake and alert Pain management: pain level controlled Vital Signs Assessment: post-procedure vital signs reviewed and stable Respiratory status: spontaneous breathing, nonlabored ventilation, respiratory function stable and patient connected to nasal cannula oxygen Cardiovascular status: blood pressure returned to baseline and stable Postop Assessment: no signs of nausea or vomiting Anesthetic complications: no    Last Vitals:  Filed Vitals:   02/20/15 1010 02/20/15 1015  BP:  96/66  Pulse: 80 93  Temp:  37 C  Resp: 10 19    Last Pain: There were no vitals filed for this visit.               Shelton SilvasKevin D Annalaura Sauseda

## 2015-02-20 NOTE — Discharge Instructions (Signed)

## 2015-02-20 NOTE — Transfer of Care (Signed)
Immediate Anesthesia Transfer of Care Note  Patient: Natalie [name]  Procedure(s) Performed: Procedure(s): LAPAROSCOPY OPERATIVE (N/A) INTRAUTERINE DEVICE (IUD) REMOVAL (N/A)  Patient Location: PACU  Anesthesia Type:General  Level of Consciousness: awake, alert  and oriented  Airway & Oxygen Therapy: Patient Spontanous Breathing and Patient connected to nasal cannula oxygen  Post-op Assessment: Report given to RN and Post -op Vital signs reviewed and stable  Post vital signs: Reviewed and stable  Last Vitals:  Filed Vitals:   02/20/15 0607  BP: 101/65  Pulse: 78  Temp: 36.6 C    Complications: No apparent anesthesia complications

## 2015-02-20 NOTE — OR Nursing (Signed)
No honeycomb dressing due to 5mm ports. MD used dermabond to close

## 2015-02-20 NOTE — Anesthesia Procedure Notes (Signed)
Procedure Name: Intubation Date/Time: 02/20/2015 7:31 AM Performed by: Graciela HusbandsFUSSELL, Jhonny Calixto O Pre-anesthesia Checklist: Patient identified, Timeout performed, Emergency Drugs available, Patient being monitored and Suction available Patient Re-evaluated:Patient Re-evaluated prior to inductionOxygen Delivery Method: Circle system utilized Preoxygenation: Pre-oxygenation with 100% oxygen Intubation Type: IV induction Ventilation: Mask ventilation without difficulty Laryngoscope Size: Mac and 3 Grade View: Grade I Tube type: Oral Tube size: 7.0 mm Number of attempts: 1 Airway Equipment and Method: Stylet Placement Confirmation: ETT inserted through vocal cords under direct vision,  breath sounds checked- equal and bilateral and positive ETCO2 Secured at: 20 cm Tube secured with: Tape Dental Injury: Teeth and Oropharynx as per pre-operative assessment

## 2015-02-20 NOTE — Interval H&P Note (Signed)
History and Physical Interval Note:  02/20/2015 7:01 AM  Natalie [name]  has presented today for surgery, with the diagnosis of Embedded IUD  The various methods of treatment have been discussed with the patient and family. After consideration of risks, benefits and other options for treatment, the patient has consented to  Procedure(s): LAPAROSCOPY OPERATIVE (N/A) INTRAUTERINE DEVICE (IUD) REMOVAL (N/A) as a surgical intervention .  The patient's history has been reviewed, patient examined, no change in status, stable for surgery.  I have reviewed the patient's chart and labs.  Questions were answered to the patient's satisfaction.     Bovard-Stuckert, Gwenn Teodoro

## 2015-02-21 ENCOUNTER — Encounter (HOSPITAL_COMMUNITY): Payer: Self-pay | Admitting: Obstetrics and Gynecology

## 2015-02-21 NOTE — Op Note (Signed)
NAMJess Barters:  [name], Natalie             ACCOUNT NO.:  [id number]  MEDICAL RECORD NO.:  [id number]  LOCATION:  [facility]                          [facility]  PHYSICIAN:  Sherron MondayJody Bovard, MD        DATE OF BIRTH:  1985/11/13  DATE OF PROCEDURE:  02/20/2015 DATE OF DISCHARGE:  02/20/2015                              OPERATIVE REPORT   PREOPERATIVE DIAGNOSIS:  IUD perforated through the uterus.  POSTOPERATIVE DIAGNOSIS:  IUD perforated through the uterus, removed.  PROCEDURE:  Operative laparoscopy, IUD removal.  SURGEON:  Sherron MondayJody Bovard, MD.  ASSISTANT:  Georges Lynchracey Sumner, RNFA.  ANESTHESIA:  Local and general.  IV FLUIDS:  Appropriate.  URINE OUTPUT:  50 mL via I and O cath prior to procedure.  EBL:  Minimal, less than 20 mL.  COMPLICATIONS:  None.  PATHOLOGY:  None.  IUD to disposable.  PROCEDURE IN DETAIL:  After informed consent was reviewed with the patient, she was transported to the OR, placed on table in supine position.  General anesthesia was induced, found be adequate.  She was then placed in the [place] stirrups.  Prepped and draped in the normal sterile fashion.  Her bladder was sterilely drained.  Hulka manipulator was placed in her cervix.  Gloves and gown were changed.  Attention was turned to the abdominal portion of the case.  Approximately, 1.5 cm vertical infraumbilical incision was made using the Veress needle. After the hanging drop test was passed, the pneumoperitoneum was obtained.  The trocar was placed under direct visualization.  A brief pelvic survey revealed the IUD embedded at the posterior lower uterine segment.  An accessory port was placed on the left under direct visualization.  An Atraumatic grasper was placed through this port and the IUD was removed from the uterus and removed from the body in its entirety.  The brief pelvic survey was performed revealing normal liver edge, normal appearing, gallbladder appendix was unable to be visualized,  normal tubes and ovaries bilaterally.  The instruments were removed after the pneumoperitoneum was released.  The ports were closed with a subcu suture of 3-0 Vicryl and Dermabond at the incision sites.  The patient tolerated the procedure well.  Sponge, lap, and needle counts were correct x2.     Sherron MondayJody Bovard, MD     JB/MEDQ  D:  02/20/2015  T:  02/21/2015  Job:  [id number]

## 2015-12-26 ENCOUNTER — Other Ambulatory Visit: Payer: Self-pay | Admitting: Obstetrics and Gynecology

## 2015-12-26 DIAGNOSIS — N63 Unspecified lump in unspecified breast: Secondary | ICD-10-CM

## 2016-01-01 ENCOUNTER — Ambulatory Visit
Admission: RE | Admit: 2016-01-01 | Discharge: 2016-01-01 | Disposition: A | Discharge status: Home / Self Care | Payer: Managed Care, Other (non HMO) | Source: Ambulatory Visit | Attending: Obstetrics and Gynecology | Admitting: Obstetrics and Gynecology

## 2016-01-01 DIAGNOSIS — N63 Unspecified lump in unspecified breast: Secondary | ICD-10-CM

## 2016-02-12 LAB — OB RESULTS CONSOLE RPR: RPR: NONREACTIVE

## 2016-02-12 LAB — OB RESULTS CONSOLE GC/CHLAMYDIA
Chlamydia: NEGATIVE
Gonorrhea: NEGATIVE

## 2016-02-12 LAB — OB RESULTS CONSOLE RUBELLA ANTIBODY, IGM: RUBELLA: IMMUNE

## 2016-02-12 LAB — OB RESULTS CONSOLE HEPATITIS B SURFACE ANTIGEN: HEP B S AG: NEGATIVE

## 2016-02-12 LAB — OB RESULTS CONSOLE HIV ANTIBODY (ROUTINE TESTING): HIV: NONREACTIVE

## 2016-02-12 LAB — OB RESULTS CONSOLE ABO/RH: RH TYPE: POSITIVE

## 2016-02-12 LAB — OB RESULTS CONSOLE ANTIBODY SCREEN: Antibody Screen: NEGATIVE

## 2016-03-16 NOTE — L&D Delivery Note (Addendum)
Delivery Note Pt complete with pressure.  Pushed very well x 5 minutes for delivery.  At 12:36 PM a viable and healthy female was delivered via  (Presentation: OA; LOT  ).  APGAR:9 9, ; weight  P.   Placenta status: delivered, intact .  Cord: 3V with the following complications: none.    Anesthesia:  Epidural  Episiotomy:  none Lacerations:  2nd degree perineal Suture Repair: 3.0 vicryl rapide Est. Blood Loss (mL):  150  Mom to postpartum.  Baby to Couplet care / Skin to Skin.  Natalie [name] 09/10/2016, 12:52 PM  Br/Contra Nexplanon vs [facility]

## 2016-08-14 LAB — OB RESULTS CONSOLE GBS
GBS: NEGATIVE
GBS: NEGATIVE

## 2016-09-09 ENCOUNTER — Inpatient Hospital Stay (HOSPITAL_COMMUNITY)
Admission: AD | Admit: 2016-09-09 | Discharge: 2016-09-12 | DRG: 775 | Disposition: A | Discharge status: Home / Self Care | Payer: Medicaid Other | Source: Ambulatory Visit | Attending: Obstetrics and Gynecology | Admitting: Obstetrics and Gynecology

## 2016-09-09 ENCOUNTER — Encounter (HOSPITAL_COMMUNITY): Payer: Self-pay

## 2016-09-09 DIAGNOSIS — O471 False labor at or after 37 completed weeks of gestation: Secondary | ICD-10-CM

## 2016-09-09 DIAGNOSIS — Z3493 Encounter for supervision of normal pregnancy, unspecified, third trimester: Secondary | ICD-10-CM | POA: Diagnosis present

## 2016-09-09 DIAGNOSIS — Z3A39 39 weeks gestation of pregnancy: Secondary | ICD-10-CM | POA: Diagnosis not present

## 2016-09-09 MED ORDER — LACTATED RINGERS IV SOLN: 500.0000 mL | INTRAVENOUS | Status: DC | PRN

## 2016-09-09 MED ORDER — ACETAMINOPHEN 325 MG PO TABS: 650.0000 mg | ORAL_TABLET | ORAL | Status: DC | PRN

## 2016-09-09 MED ORDER — OXYTOCIN 40 UNITS IN LACTATED RINGERS INFUSION - SIMPLE MED
2.5000 [IU]/h | INTRAVENOUS | Status: DC
  Administered 2016-09-10: 2.5 [IU]/h via INTRAVENOUS
  Filled 2016-09-09: qty 1000

## 2016-09-09 MED ORDER — LIDOCAINE HCL (PF) 1 % IJ SOLN
30.0000 mL | INTRAMUSCULAR | Status: DC | PRN
  Filled 2016-09-09: qty 30

## 2016-09-09 MED ORDER — LACTATED RINGERS IV SOLN
INTRAVENOUS | Status: DC
  Administered 2016-09-10: 10:00:00 via INTRAVENOUS

## 2016-09-09 MED ORDER — OXYCODONE-ACETAMINOPHEN 5-325 MG PO TABS
2.0000 | ORAL_TABLET | ORAL | Status: DC | PRN
Start: 2016-09-09 — End: 2016-09-10

## 2016-09-09 MED ORDER — BUTORPHANOL TARTRATE 1 MG/ML IJ SOLN: 1.0000 mg | INTRAMUSCULAR | Status: DC | PRN

## 2016-09-09 MED ORDER — ONDANSETRON HCL 4 MG/2ML IJ SOLN
4.0000 mg | Freq: Four times a day (QID) | INTRAMUSCULAR | Status: DC | PRN
  Administered 2016-09-10: 4 mg via INTRAVENOUS
  Filled 2016-09-09: qty 2

## 2016-09-09 MED ORDER — OXYTOCIN BOLUS FROM INFUSION
500.0000 mL | Freq: Once | INTRAVENOUS | Status: AC
  Administered 2016-09-10: 500 mL via INTRAVENOUS

## 2016-09-09 MED ORDER — OXYCODONE-ACETAMINOPHEN 5-325 MG PO TABS
1.0000 | ORAL_TABLET | ORAL | Status: DC | PRN
Start: 2016-09-09 — End: 2016-09-10

## 2016-09-09 MED ORDER — SOD CITRATE-CITRIC ACID 500-334 MG/5ML PO SOLN: 30.0000 mL | ORAL | Status: DC | PRN

## 2016-09-09 NOTE — MAU Note (Signed)
Pt here with c/o contractions, some bleeding. Reports positive fetal movement.

## 2016-09-09 NOTE — MAU Note (Signed)
Pt may ambulate x1 hour per Dr. Jackelyn KnifeMeisinger. Fetal tracing reactive. Pt okay with plan of care.

## 2016-09-10 ENCOUNTER — Inpatient Hospital Stay (HOSPITAL_COMMUNITY): Payer: Medicaid Other | Admitting: Anesthesiology

## 2016-09-10 ENCOUNTER — Encounter (HOSPITAL_COMMUNITY): Payer: Self-pay | Admitting: Obstetrics and Gynecology

## 2016-09-10 LAB — CBC
HEMATOCRIT: 36.4 % (ref 36.0–46.0)
HEMOGLOBIN: 12.6 g/dL (ref 12.0–15.0)
MCH: 31.1 pg (ref 26.0–34.0)
MCHC: 34.6 g/dL (ref 30.0–36.0)
MCV: 89.9 fL (ref 78.0–100.0)
Platelets: 165 10*3/uL (ref 150–400)
RBC: 4.05 MIL/uL (ref 3.87–5.11)
RDW: 14.8 % (ref 11.5–15.5)
WBC: 15.2 10*3/uL — ABNORMAL HIGH (ref 4.0–10.5)

## 2016-09-10 LAB — TYPE AND SCREEN
ABO/RH(D): O POS
ANTIBODY SCREEN: NEGATIVE

## 2016-09-10 LAB — RPR: RPR Ser Ql: NONREACTIVE

## 2016-09-10 MED ORDER — COCONUT OIL OIL
1.0000 "application " | TOPICAL_OIL | Status: DC | PRN
  Administered 2016-09-11: 1 via TOPICAL
  Filled 2016-09-10: qty 120

## 2016-09-10 MED ORDER — IBUPROFEN 600 MG PO TABS
600.0000 mg | ORAL_TABLET | Freq: Four times a day (QID) | ORAL | Status: DC
  Administered 2016-09-10 – 2016-09-12 (×8): 600 mg via ORAL
  Filled 2016-09-10 (×8): qty 1

## 2016-09-10 MED ORDER — TETANUS-DIPHTH-ACELL PERTUSSIS 5-2.5-18.5 LF-MCG/0.5 IM SUSP: 0.5000 mL | Freq: Once | INTRAMUSCULAR | Status: DC

## 2016-09-10 MED ORDER — PHENYLEPHRINE 40 MCG/ML (10ML) SYRINGE FOR IV PUSH (FOR BLOOD PRESSURE SUPPORT)
80.0000 ug | PREFILLED_SYRINGE | INTRAVENOUS | Status: DC | PRN
  Filled 2016-09-10: qty 5
  Filled 2016-09-10: qty 10

## 2016-09-10 MED ORDER — DIPHENHYDRAMINE HCL 50 MG/ML IJ SOLN: 12.5000 mg | INTRAMUSCULAR | Status: DC | PRN

## 2016-09-10 MED ORDER — DIBUCAINE 1 % RE OINT: 1.0000 "application " | TOPICAL_OINTMENT | RECTAL | Status: DC | PRN

## 2016-09-10 MED ORDER — WITCH HAZEL-GLYCERIN EX PADS: 1.0000 "application " | MEDICATED_PAD | CUTANEOUS | Status: DC | PRN

## 2016-09-10 MED ORDER — SENNOSIDES-DOCUSATE SODIUM 8.6-50 MG PO TABS
2.0000 | ORAL_TABLET | ORAL | Status: DC
  Administered 2016-09-11 (×2): 2 via ORAL
  Filled 2016-09-10 (×2): qty 2

## 2016-09-10 MED ORDER — LACTATED RINGERS IV SOLN: INTRAVENOUS | Status: DC

## 2016-09-10 MED ORDER — ONDANSETRON HCL 4 MG/2ML IJ SOLN: 4.0000 mg | INTRAMUSCULAR | Status: DC | PRN

## 2016-09-10 MED ORDER — OXYCODONE HCL 5 MG PO TABS: 10.0000 mg | ORAL_TABLET | ORAL | Status: DC | PRN

## 2016-09-10 MED ORDER — EPHEDRINE 5 MG/ML INJ
10.0000 mg | INTRAVENOUS | Status: DC | PRN
  Filled 2016-09-10: qty 2

## 2016-09-10 MED ORDER — LIDOCAINE HCL (PF) 1 % IJ SOLN
INTRAMUSCULAR | Status: DC | PRN
  Administered 2016-09-10 (×2): 5 mL via EPIDURAL

## 2016-09-10 MED ORDER — PHENYLEPHRINE 40 MCG/ML (10ML) SYRINGE FOR IV PUSH (FOR BLOOD PRESSURE SUPPORT)
80.0000 ug | PREFILLED_SYRINGE | INTRAVENOUS | Status: DC | PRN
Start: 2016-09-10 — End: 2016-09-12
  Administered 2016-09-10: 80 ug via INTRAVENOUS
  Filled 2016-09-10: qty 5
  Filled 2016-09-10: qty 10

## 2016-09-10 MED ORDER — DIPHENHYDRAMINE HCL 25 MG PO CAPS: 25.0000 mg | ORAL_CAPSULE | Freq: Four times a day (QID) | ORAL | Status: DC | PRN

## 2016-09-10 MED ORDER — TERBUTALINE SULFATE 1 MG/ML IJ SOLN
0.2500 mg | Freq: Once | INTRAMUSCULAR | Status: DC | PRN
  Filled 2016-09-10: qty 1

## 2016-09-10 MED ORDER — PRENATAL MULTIVITAMIN CH
1.0000 | ORAL_TABLET | Freq: Every day | ORAL | Status: DC
  Administered 2016-09-11 – 2016-09-12 (×2): 1 via ORAL
  Filled 2016-09-10 (×2): qty 1

## 2016-09-10 MED ORDER — OXYCODONE HCL 5 MG PO TABS: 5.0000 mg | ORAL_TABLET | ORAL | Status: DC | PRN

## 2016-09-10 MED ORDER — SIMETHICONE 80 MG PO CHEW: 80.0000 mg | CHEWABLE_TABLET | ORAL | Status: DC | PRN

## 2016-09-10 MED ORDER — ONDANSETRON HCL 4 MG PO TABS: 4.0000 mg | ORAL_TABLET | ORAL | Status: DC | PRN

## 2016-09-10 MED ORDER — FENTANYL 2.5 MCG/ML BUPIVACAINE 1/10 % EPIDURAL INFUSION (WH - ANES)
14.0000 mL/h | INTRAMUSCULAR | Status: DC | PRN
  Administered 2016-09-10: 14 mL/h via EPIDURAL
  Filled 2016-09-10: qty 100

## 2016-09-10 MED ORDER — LACTATED RINGERS IV SOLN
500.0000 mL | Freq: Once | INTRAVENOUS | Status: AC
  Administered 2016-09-10: 500 mL via INTRAVENOUS

## 2016-09-10 MED ORDER — ZOLPIDEM TARTRATE 5 MG PO TABS: 5.0000 mg | ORAL_TABLET | Freq: Every evening | ORAL | Status: DC | PRN

## 2016-09-10 MED ORDER — ACETAMINOPHEN 325 MG PO TABS: 650.0000 mg | ORAL_TABLET | ORAL | Status: DC | PRN

## 2016-09-10 MED ORDER — BENZOCAINE-MENTHOL 20-0.5 % EX AERO
1.0000 "application " | INHALATION_SPRAY | CUTANEOUS | Status: DC | PRN
  Administered 2016-09-10 – 2016-09-12 (×2): 1 via TOPICAL
  Filled 2016-09-10 (×2): qty 56

## 2016-09-10 MED ORDER — OXYTOCIN 40 UNITS IN LACTATED RINGERS INFUSION - SIMPLE MED
1.0000 m[IU]/min | INTRAVENOUS | Status: DC
  Administered 2016-09-10: 2 m[IU]/min via INTRAVENOUS

## 2016-09-10 NOTE — Anesthesia Postprocedure Evaluation (Signed)
Anesthesia Post Note  Patient: Maricsa Want  Procedure(s) Performed: * No procedures listed *     Patient location during evaluation: Mother Baby Anesthesia Type: Epidural Level of consciousness: awake Pain management: pain level controlled Vital Signs Assessment: post-procedure vital signs reviewed and stable Respiratory status: spontaneous breathing Cardiovascular status: stable Postop Assessment: no headache, no backache, epidural receding, patient able to bend at knees, no signs of nausea or vomiting and adequate PO intake Anesthetic complications: no    Last Vitals:  Vitals:   09/10/16 1413 09/10/16 1530  BP: (!) 91/53 (!) 101/55  Pulse: 79 69  Resp: 18 18  Temp: 36.8 C 36.8 C    Last Pain:  Vitals:   09/10/16 1530  TempSrc: Oral  PainSc: 0-No pain   Pain Goal: Patients Stated Pain Goal: 8 (09/10/16 0100)               Fanny DanceMULLINS,Shams Fill

## 2016-09-10 NOTE — Anesthesia Preprocedure Evaluation (Addendum)
Anesthesia Evaluation  Patient identified by MRN, date of birth, ID band Patient awake    Reviewed: Allergy & Precautions, NPO status , Patient's Chart, lab work & pertinent test results  History of Anesthesia Complications Negative for: history of anesthetic complications  Airway Mallampati: II   Neck ROM: Full    Dental  (+) Teeth Intact   Pulmonary neg pulmonary ROS,    breath sounds clear to auscultation       Cardiovascular negative cardio ROS   Rhythm:Regular Rate:Normal     Neuro/Psych negative neurological ROS  negative psych ROS   GI/Hepatic negative GI ROS, Neg liver ROS,   Endo/Other  negative endocrine ROS  Renal/GU negative Renal ROS  negative genitourinary   Musculoskeletal negative musculoskeletal ROS (+)   Abdominal   Peds negative pediatric ROS (+)  Hematology negative hematology ROS (+)   Anesthesia Other Findings   Reproductive/Obstetrics (+) Pregnancy                            Anesthesia Physical  Anesthesia Plan  ASA: II  Anesthesia Plan: Epidural   Post-op Pain Management:    Induction:   PONV Risk Score and Plan:   Airway Management Planned: Natural Airway  Additional Equipment:   Intra-op Plan:   Post-operative Plan:   Informed Consent: I have reviewed the patients History and Physical, chart, labs and discussed the procedure including the risks, benefits and alternatives for the proposed anesthesia with the patient or authorized representative who has indicated his/her understanding and acceptance.   Dental advisory given  Plan Discussed with: Anesthesiologist  Anesthesia Plan Comments:        Anesthesia Quick Evaluation

## 2016-09-10 NOTE — Lactation Note (Signed)
This note was copied from a baby's chart. Lactation Consultation Note  Patient Name: Girl Jess BartersMariem Brossart ZOXWR'UToday's Date: 09/10/2016 Reason for consult: Initial assessment   Initial assessment with Exp BF [facility]. Mom reports she has not latched infant and does not want to until mom is cleaned up. FOB holding infant who is cueing to feed. Enc mom to feed infant STS 8-12 x in 24 hours at first feeding cues. Mom voiced understanding. Enc mom to use pillow and head support with feeding. Mom reports she is aware of how to hand express.   BF Resources handout and LC Brochure given, mom informed of IP/OP Services, BF Support Groups and LC phone #. Enc mom to call out for feeding assistance as needed. [facility] and is aware to call and make appt post d/c.    Maternal Data Formula Feeding for Exclusion: Yes Reason for exclusion: Mother's choice to formula and breast feed on admission Has patient been taught Hand Expression?: Yes Does the patient have breastfeeding experience prior to this delivery?: Yes  Feeding    LATCH Score/Interventions                      Lactation Tools Discussed/Used WIC Program: Yes   Consult Status Consult Status: Follow-up Date: 09/04/16 Follow-up type: In-patient    Silas FloodSharon S Roanne Haye 09/10/2016, 1:51 PM

## 2016-09-10 NOTE — Anesthesia Procedure Notes (Signed)
Epidural Patient location during procedure: OB Start time: 09/10/2016 9:38 AM End time: 09/10/2016 9:56 AM  Staffing Anesthesiologist: Natalie [name], Natalie [name] Performed: anesthesiologist   Preanesthetic Checklist Completed: patient identified, site marked, pre-op evaluation, timeout performed, IV checked, risks and benefits discussed and monitors and equipment checked  Epidural Patient position: sitting Prep: DuraPrep Patient monitoring: heart rate, cardiac monitor, continuous pulse ox and blood pressure Approach: midline Location: L2-L3 Injection technique: LOR saline  Needle:  Needle type: Tuohy  Needle gauge: 17 G Needle length: 9 cm Needle insertion depth: 5 cm Catheter size: 20 Guage Catheter at skin depth: 10 cm Test dose: negative and Other  Assessment Events: blood not aspirated, injection not painful, no injection resistance and negative IV test  Additional Notes Informed consent obtained prior to proceeding including risk of failure, 1% risk of PDPH, risk of minor discomfort and bruising.  Discussed rare but serious complications including epidural abscess, permanent nerve injury, epidural hematoma.  Discussed alternatives to epidural analgesia and patient desires to proceed.  Timeout performed pre-procedure verifying patient name, procedure, and platelet count.  Patient tolerated procedure well.

## 2016-09-10 NOTE — Progress Notes (Signed)
Patient ID: Natalie BartersMariem [name], female   DOB: [birth year], 31 y.o.   MRN: [id number]   No problems, some pain.  Deciding re pain management  AFVSS gen NAD FHTs 120's, mod var, + accels, category 1 toco q 2 - 4min  AROM at 6:30am, clear SVE 4.5/80/-1  Continue current mgmt, add pitocin prn

## 2016-09-10 NOTE — H&P (Signed)
Natalie BartersMariem [name] is a 31 y.o. female, G3P2002, EGA [redacted] weeks with EDC 7-2 presenting for ctx.  She was evaluated in MAU over several hours with ctx that varied, VE changed from 3 to 4 cm per RN and ctx were painful so she was admitted.  Prenatal care essentially uncomplicated.  OB History    Gravida Para Term Preterm AB Living   3 2 2  0 0 2   SAB TAB Ectopic Multiple Live Births   0 0 0 0 2     Past Medical History:  Diagnosis Date  . No pertinent past medical history   . Normal pregnancy, repeat 07/01/2011  . S/P laparoscopy 02/20/2015  . SVD (spontaneous vaginal delivery) 07/02/2011   Past Surgical History:  Procedure Laterality Date  . HYSTEROSCOPY N/A 01/07/2015   Procedure: HYSTEROSCOPY DIAGNOSTIC, ATTEMPTED REMOVAL OF iNTRAUTERINE DEVICE;  Surgeon: Sherian ReinJody Bovard-Stuckert, MD;  Location: [facility];  Service: Gynecology;  Laterality: N/A;  MD requests 1hr OR time  . IUD REMOVAL N/A 02/20/2015   Procedure: INTRAUTERINE DEVICE (IUD) REMOVAL;  Surgeon: Sherian ReinJody Bovard-Stuckert, MD;  Location: [facility];  Service: Gynecology;  Laterality: N/A;  . LAPAROSCOPY N/A 02/20/2015   Procedure: LAPAROSCOPY OPERATIVE;  Surgeon: Sherian ReinJody Bovard-Stuckert, MD;  Location: [facility];  Service: Gynecology;  Laterality: N/A;  . NO PAST SURGERIES     Family History: family history is not on file. Social History:  reports that she has never smoked. She has never used smokeless tobacco. She reports that she does not drink alcohol or use drugs.     Maternal Diabetes: No Genetic Screening: Normal Maternal Ultrasounds/Referrals: Normal Fetal Ultrasounds or other Referrals:  None Maternal Substance Abuse:  No Significant Maternal Medications:  None Significant Maternal Lab Results:  Lab values include: Group B Strep negative Other Comments:  None  Review of Systems  Respiratory: Negative.   Cardiovascular: Negative.    Maternal Medical History:  Reason for admission: Contractions.   Contractions: Frequency: irregular.    Perceived severity is moderate.    Fetal activity: Perceived fetal activity is normal.    Prenatal complications: no prenatal complications Prenatal Complications - Diabetes: none.   AROM-clear Dilation: (P) 4 Effacement (%): (P) 50 Station: (P) -2 Exam by:: (P) Dr. Jackelyn KnifeMeisinger Blood pressure (!) 103/57, pulse 74, temperature 98.1 F (36.7 C), temperature source Oral, resp. rate 16, height 5\' 3"  (1.6 m), weight 64.9 kg (143 lb), SpO2 100 %, unknown if currently breastfeeding. Maternal Exam:  Uterine Assessment: Contraction strength is moderate.  Contraction frequency is irregular.   Abdomen: Patient reports no abdominal tenderness. Estimated fetal weight is 7 lbs.   Fetal presentation: vertex  Introitus: Normal vulva. Normal vagina.  Amniotic fluid character: clear.  Pelvis: adequate for delivery.   Cervix: Cervix evaluated by digital exam.     Fetal Exam Fetal Monitor Review: Mode: ultrasound.   Baseline rate: 110-120.  Variability: moderate (6-25 bpm).   Pattern: accelerations present, variable decelerations and late decelerations.    Fetal State Assessment: Category II - tracings are indeterminate.     Physical Exam  Vitals reviewed. Constitutional: She appears well-developed and well-nourished.  Cardiovascular: Normal rate and regular rhythm.   Respiratory: Effort normal. No respiratory distress.  GI: Soft.    Prenatal labs: ABO, Rh: --/--/O POS (06/28 0010) Antibody: NEG (06/28 0010) Rubella: Immune (11/29 0000) RPR: Nonreactive (11/29 0000)  HBsAg: Negative (11/29 0000)  HIV: Non-reactive (11/29 0000)  GBS: Negative, Negative (06/01 0000)   Assessment/Plan: IUP at 39+ weeks in latent  vs active labor.  She was admitted last night with painful ctx and cervical change per RN.  Ctx have been irregular, no change in cervix over the past few hours.  AROM done for augmentation, will monitor progress and see if needs pitocin.     Natalie [name] 09/10/2016,  6:50 AM

## 2016-09-11 LAB — CBC
HEMATOCRIT: 32.6 % — AB (ref 36.0–46.0)
Hemoglobin: 11.1 g/dL — ABNORMAL LOW (ref 12.0–15.0)
MCH: 31.3 pg (ref 26.0–34.0)
MCHC: 34 g/dL (ref 30.0–36.0)
MCV: 91.8 fL (ref 78.0–100.0)
Platelets: 167 10*3/uL (ref 150–400)
RBC: 3.55 MIL/uL — AB (ref 3.87–5.11)
RDW: 15 % (ref 11.5–15.5)
WBC: 14.5 10*3/uL — AB (ref 4.0–10.5)

## 2016-09-11 NOTE — Progress Notes (Signed)
Post Partum Day 1 Subjective: no complaints, up ad lib, voiding, tolerating PO, + flatus and bonding well with baby - breastfeeding. Lochia mild.   Objective: Blood pressure (!) 90/59, pulse 76, temperature 97.5 F (36.4 C), resp. rate 18, height 5\' 3"  (1.6 m), weight 143 lb (64.9 kg), SpO2 96 %, unknown if currently breastfeeding.  Physical Exam:  General: alert, cooperative and no distress Lochia: appropriate Uterine Fundus: firm Incision: n/a DVT Evaluation: No evidence of DVT seen on physical exam. Negative Homan's sign.   Recent Labs  09/10/16 0010 09/11/16 0517  HGB 12.6 11.1*  HCT 36.4 32.6*    Assessment/Plan: Plan for discharge tomorrow and Breastfeeding   LOS: 2 days   Natalie [name] 09/11/2016, 9:11 AM

## 2016-09-11 NOTE — Lactation Note (Signed)
This note was copied from a baby's chart. Lactation Consultation Note  Patient Name: Natalie [name] [id number]'s Date: 09/11/2016 Reason for consult: Follow-up assessment;Breast/nipple pain  Follow up visit at 33 hours of age.  Mom holding baby asleep and swaddled in her arms.  Baby is sucking on a pacifier.  Mom reports nipple pain. LC discussed not using a paci and mom plans to continue because baby is sleepy at breast after feeding. LC discussed offering spoon feedings with EBM.  Mom reports she only sees a drop when she hand expressed.  LC explained that baby with suck longer to increase milk supply with demand.  Mom not receptive to teaching at this time. LC encouraged mom to call for RN LATCH assess during the night.    Maternal Data    Feeding Feeding Type: Breast Fed Length of feed: 20 min  LATCH Score/Interventions                      Lactation Tools Discussed/Used     Consult Status Consult Status: Follow-up Date: 09/12/16 Follow-up type: In-patient    Marius Betts, Arvella MerlesJana Lynn 09/11/2016, 10:14 PM

## 2016-09-12 MED ORDER — IBUPROFEN 600 MG PO TABS: 600.0000 mg | ORAL_TABLET | Freq: Four times a day (QID) | ORAL | 1 refills | Status: DC | PRN

## 2016-09-12 NOTE — Discharge Summary (Signed)
OB Discharge Summary     Patient Name: Natalie [name] DOB: Sep 24, 1985 MRN: [id number]  Date of admission: 09/09/2016 Delivering MD: Sherian Rein   Date of discharge: 09/12/2016  Admitting diagnosis: 38WKS PAIN AND BLEEDING, MAYBE CTX Intrauterine pregnancy: [redacted]w[redacted]d     Secondary diagnosis:  Principal Problem:   SVD (spontaneous vaginal delivery) Active Problems:   Indication for care in labor or delivery  Additional problems: none     Discharge diagnosis: Term Pregnancy Delivered                                                                                                Post partum procedures:none  Augmentation: AROM and Pitocin  Complications: None  Hospital course:  Onset of Labor With Vaginal Delivery     31 y.o. yo [id number] at [redacted]w[redacted]d was admitted in Latent Labor on 09/09/2016. Patient had an uncomplicated labor course as follows:  Membrane Rupture Time/Date: 6:46 AM ,09/10/2016   Intrapartum Procedures: Episiotomy: None [1]                                         Lacerations:  2nd degree [3]  Patient had a delivery of a Viable infant. 09/10/2016  Information for the patient's newborn:  Raghad, Lorenz Girl Yvette [[id number]]  Delivery Method: Vaginal, Spontaneous Delivery (Filed from Delivery Summary)    Pateint had an uncomplicated postpartum course.  She is ambulating, tolerating a regular diet, passing flatus, and urinating well. Patient is discharged home in stable condition on 09/12/16.   Physical exam  Vitals:   09/10/16 1954 09/11/16 0623 09/11/16 1726 09/12/16 0540  BP: (!) 90/53 (!) 90/59 (!) 86/58 (!) 92/55  Pulse: 82 76 83 80  Resp: 18 18 18 16   Temp: 98.4 F (36.9 C) 97.5 F (36.4 C) 97.9 F (36.6 C) 98.2 F (36.8 C)  TempSrc: Oral  Oral Oral  SpO2:      Weight:      Height:       General: alert, cooperative and no distress Lochia: appropriate Uterine Fundus: firm Incision: N/A DVT Evaluation: No evidence of DVT seen on physical  exam. Negative Homan's sign. Labs: Lab Results  Component Value Date   WBC 14.5 (H) 09/11/2016   HGB 11.1 (L) 09/11/2016   HCT 32.6 (L) 09/11/2016   MCV 91.8 09/11/2016   PLT 167 09/11/2016   CMP Latest Ref Rng & Units 02/15/2015  Glucose 65 - 99 mg/dL 86  BUN 6 - 20 mg/dL 14  Creatinine 1.30 - 8.65 mg/dL 7.84  Sodium 696 - 295 mmol/L 138  Potassium 3.5 - 5.1 mmol/L 3.8  Chloride 101 - 111 mmol/L 108  CO2 22 - 32 mmol/L 26  Calcium 8.9 - 10.3 mg/dL 9.0  Total Protein 6.5 - 8.1 g/dL 8.0  Total Bilirubin 0.3 - 1.2 mg/dL 0.8  Alkaline Phos 38 - 126 U/L 31(L)  AST 15 - 41 U/L 16  ALT 14 - 54 U/L 20    Discharge instruction: per After Visit  Summary and "Baby and Me Booklet".  After visit meds:  Allergies as of 09/12/2016   No Known Allergies     Medication List    TAKE these medications   ibuprofen 600 MG tablet Commonly known as:  ADVIL,MOTRIN Take 1 tablet (600 mg total) by mouth every 6 (six) hours as needed for headache, mild pain, moderate pain or cramping.   prenatal multivitamin Tabs tablet Take 1 tablet by mouth daily at 12 noon.       Diet: routine diet  Activity: Advance as tolerated. Pelvic rest for 6 weeks.   Outpatient follow up:6 weeks Follow up Appt:No future appointments. Follow up Visit:No Follow-up on file.  Postpartum contraception: Undecided  Newborn Data: Live born female  Birth Weight: 7 lb 8.6 oz (3420 g) APGAR: 9, 9  Baby Feeding: Breast Disposition:home with mother   09/12/2016 Cathrine Musterecilia W Elleni Mozingo, DO

## 2016-09-12 NOTE — Discharge Instructions (Signed)
Nothing in vagina for 6 weeks.  No sex, tampons, and douching.  Other instructions as in [facility] Discharge Booklet. °

## 2016-09-12 NOTE — Progress Notes (Signed)
Patient ID: Natalie BartersMariem Sochacki, female   DOB: [birth year], 31 y.o.   MRN: [id number] Pt doing well with no complaints. Ambulating well with no lightheadedness, voiding with no difficulty, breastfeeding and bonding well with baby. No fever, chill, HA, CP or SOB VSS ABD - FF and 2cm below umbil EXT - no homans  A/P: PPD#2 s/p svd-stable         Discharge instructions reviewed          F/u in 6weeks

## 2016-09-12 NOTE — Lactation Note (Signed)
This note was copied from a baby's chart. Lactation Consultation Note  Patient Name: Natalie [name]'s Date: 09/12/2016 Reason for consult: Follow-up assessment   Follow up with mom of 46 hour old infant. Mom had infant latched to the breast and [facility] feeding [facility]. Mom reports nipple tenderness with latch, she is applying EBM post feeding.   Manual pump given with instructions for use and cleaning. Reviewed I/O, Breast milk handling and storage and engorgement prevention/treatment. Mom has LC phone #. Enc mom to call with questions/concerns.   [facility] Infant with follow up Ped appt on Monday.    Maternal Data Formula Feeding for Exclusion: No Has patient been taught Hand Expression?: Yes Does the patient have breastfeeding experience prior to this delivery?: Yes  Feeding Feeding Type: Breast Fed Length of feed: 15 min  LATCH Score/Interventions Latch: Grasps breast easily, tongue down, lips flanged, rhythmical sucking.  Audible Swallowing: Spontaneous and intermittent  Type of Nipple: Everted at rest and after stimulation Intervention(s): No intervention needed  Comfort (Breast/Nipple): Filling, red/small blisters or bruises, mild/mod discomfort  Problem noted: Mild/Moderate discomfort Interventions  (Cracked/bleeding/bruising/blister): Expressed breast milk to nipple  Hold (Positioning): No assistance needed to correctly position infant at breast.  LATCH Score: 9  Lactation Tools Discussed/Used WIC Program: Yes Pump Review: Setup, frequency, and cleaning;Milk Storage   Consult Status Consult Status: Complete Follow-up type: Call as needed    Ed BlalockSharon S Starsha Morning 09/12/2016, 11:34 AM

## 2017-05-21 ENCOUNTER — Ambulatory Visit: Payer: Medicaid Other | Admitting: Student in an Organized Health Care Education/Training Program

## 2017-05-21 ENCOUNTER — Encounter: Payer: Self-pay | Admitting: Student in an Organized Health Care Education/Training Program

## 2017-05-21 ENCOUNTER — Other Ambulatory Visit: Payer: Self-pay

## 2017-05-21 VITALS — BP 90/64 | HR 68 | Temp 97.9°F | Ht 62.5 in | Wt 109.4 lb

## 2017-05-21 DIAGNOSIS — Z0000 Encounter for general adult medical examination without abnormal findings: Secondary | ICD-10-CM | POA: Diagnosis present

## 2017-05-21 DIAGNOSIS — Z111 Encounter for screening for respiratory tuberculosis: Secondary | ICD-10-CM | POA: Diagnosis not present

## 2017-05-21 NOTE — Patient Instructions (Signed)
It was a pleasure seeing you today in our clinic. Today we discussed your medical history.  Our clinic's number is [phone]. Please call with questions or concerns about what we discussed today.  Be well, Dr. Mosetta PuttFeng

## 2017-05-21 NOTE — Progress Notes (Signed)
   CC:   HPI: Natalie BartersMariem [name] is a 32 y.o. female with no significant PMH [facility] care as a [facility] patient.  PMH: None  PSH: None  OB: - 3 pregnancies NSVD, 8 years, 6 years, 8 months - previously had IUD, had to have surgery - OCPs, discontinued because of side effects (dizziness)  Family Hx: Father - high blood pressure No FH of cardiac disease or diabetes  Social Hx: - Lives at home with 3 children - Designates her husband to make medical decisions on her behalf if she becomes unable to do so - Does not exercise regularly - Denies alcohol, tobacco or marijuana use. No other drug use. - Starting work at a daycare   Allergies: NKDA  Meds: Meds   Review of Symptoms:  See HPI for ROS.   CC, SH/smoking status, and VS noted.  Objective: BP 90/64   Pulse 68   Temp 97.9 F (36.6 C) (Oral)   Ht 5' 2.5" (1.588 m)   Wt 109 lb 6.4 oz (49.6 kg)   LMP 05/19/2017 (Exact Date)   SpO2 99%   BMI 19.69 kg/m  GEN: NAD, alert, cooperative, and pleasant. EYE: no conjunctival injection, pupils equally round and reactive to light ENMT: normal tympanic light reflex, no nasal polyps,no rhinorrhea, no pharyngeal erythema or exudates NECK: full ROM, no thyromegaly RESPIRATORY: clear to auscultation bilaterally with no wheezes, rhonchi or rales, good effort CV: RRR, no m/r/g, no peripheral edema GI: soft, non-tender, non-distended, no hepatosplenomegaly SKIN: warm and dry, no rashes or lesions NEURO: II-XII grossly intact, normal gait, peripheral sensation intact PSYCH: AAOx3, appropriate affect  Assessment and plan:  Encounter for medical examination to establish care - PMH, PSH, SH reviewed with patient - forms filled out for her to work at daycare - she declined flu vaccine - PPD screen completed today - follow up as needed   Orders Placed This Encounter  Procedures  . PPD    Rt forearm, @ 4:35 pm    Order Specific Question:   Has  patient ever tested positive?    Answer:   No    No orders of the defined types were placed in this encounter.    Howard PouchLauren Rithika Seel, MD,MS,  PGY2 05/26/2017 9:22 AM

## 2017-05-24 ENCOUNTER — Ambulatory Visit: Payer: Medicaid Other

## 2017-05-24 DIAGNOSIS — Z111 Encounter for screening for respiratory tuberculosis: Secondary | ICD-10-CM

## 2017-05-24 LAB — TB SKIN TEST: TB Skin Test: NEGATIVE

## 2017-05-24 NOTE — Progress Notes (Signed)
PPD Reading Note PPD read and results entered in EpicCare. Result: 0 mm induration. Interpretation: negative   Meredith B Thomsen, RN  

## 2017-05-26 ENCOUNTER — Encounter: Payer: Self-pay | Admitting: Student in an Organized Health Care Education/Training Program

## 2017-05-26 DIAGNOSIS — Z309 Encounter for contraceptive management, unspecified: Secondary | ICD-10-CM | POA: Insufficient documentation

## 2017-05-26 NOTE — Assessment & Plan Note (Signed)
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

## 2017-09-28 ENCOUNTER — Ambulatory Visit: Payer: Medicaid Other | Admitting: Student in an Organized Health Care Education/Training Program

## 2017-09-28 ENCOUNTER — Other Ambulatory Visit: Payer: Self-pay

## 2017-09-28 ENCOUNTER — Encounter: Payer: Self-pay | Admitting: Student in an Organized Health Care Education/Training Program

## 2017-09-28 VITALS — BP 92/64 | HR 79 | Temp 98.4°F | Ht 63.0 in | Wt 103.4 lb

## 2017-09-28 DIAGNOSIS — Z9889 Other specified postprocedural states: Secondary | ICD-10-CM | POA: Diagnosis not present

## 2017-09-28 DIAGNOSIS — Z3009 Encounter for other general counseling and advice on contraception: Secondary | ICD-10-CM | POA: Diagnosis not present

## 2017-09-28 LAB — POCT URINE PREGNANCY: PREG TEST UR: NEGATIVE

## 2017-09-28 MED ORDER — NORGESTIMATE-ETH ESTRADIOL 0.25-35 MG-MCG PO TABS: 1.0000 | ORAL_TABLET | Freq: Every day | ORAL | 3 refills | Status: DC

## 2017-09-28 NOTE — Patient Instructions (Signed)
It was a pleasure seeing you today in our clinic. Today we discussed contraception and prescribed the oral contraceptive pill. Here is the treatment plan we have discussed and agreed upon together:  Our clinic's number is [phone]. Please call with questions or concerns about what we discussed today.  Be well, Dr. Mosetta PuttFeng  Sign up for My Chart to have easy access to your labs results, and communication with your primary care physician.

## 2017-09-28 NOTE — Assessment & Plan Note (Signed)
Discussed contraceptive options with the patient.  She is most amenable to oral contraceptive pills.  She feels that she will be able to reliably take the medication every day.  She understands that OCP will not protect against STIs and that we do offer STI screening in our office, she declines today. - POCT urine pregnancy negative - norgestimate-ethinyl estradiol (ORTHO-CYCLEN,SPRINTEC,PREVIFEM) 0.25-35 MG-MCG tablet; Take 1 tablet by mouth daily.  Dispense: 3 Package; Refill: 3

## 2017-09-28 NOTE — Progress Notes (Signed)
   Subjective:    Natalie [name] - 32 y.o. female MRN [id number]  Date of birth: [birth year]  HPI  Natalie BartersMariem Kaminsky is here for contraception.  Patient has a 31-year-old daughter.  She reports that she has had a history of poor outcome with IUD, requiring multiple surgeries in the past.  She would like a contraceptive method other than the IUD.  She denies history of migraine headaches, no clotting disorders in her family, no history of stroke, she is a non-smoker.  She is currently menstruating.  -  reports that she has never smoked. She has never used smokeless tobacco. - Review of Systems: Per HPI. - Past Medical History: Patient Active Problem List   Diagnosis Date Noted  . Contraception management 05/26/2017  . S/P laparoscopy 02/20/2015   - Medications: reviewed and updated Current Outpatient Medications  Medication Sig Dispense Refill  . norgestimate-ethinyl estradiol (ORTHO-CYCLEN,SPRINTEC,PREVIFEM) 0.25-35 MG-MCG tablet Take 1 tablet by mouth daily. 3 Package 3   No current facility-administered medications for this visit.     Review of Systems See HPI     Objective:   Physical Exam BP 92/64   Pulse 79   Temp 98.4 F (36.9 C) (Oral)   Ht 5\' 3"  (1.6 m)   Wt 103 lb 6.4 oz (46.9 kg)   LMP 09/23/2017 (Approximate)   SpO2 99%   BMI 18.32 kg/m  Gen: NAD, alert, cooperative with exam, well-appearing  HEENT: NCAT CV: RRR Resp: non-labored Abd: SNTND Skin: no rashes, normal turgor  Neuro: no gross deficits.  Psych: good insight, alert and oriented     Assessment & Plan:   Contraception management Discussed contraceptive options with the patient.  She is most amenable to oral contraceptive pills.  She feels that she will be able to reliably take the medication every day.  She understands that OCP will not protect against STIs and that we do offer STI screening in our office, she declines today. - POCT urine pregnancy negative - norgestimate-ethinyl estradiol  (ORTHO-CYCLEN,SPRINTEC,PREVIFEM) 0.25-35 MG-MCG tablet; Take 1 tablet by mouth daily.  Dispense: 3 Package; Refill: 3      Orders Placed This Encounter  Procedures  . POCT urine pregnancy    Meds ordered this encounter  Medications  . norgestimate-ethinyl estradiol (ORTHO-CYCLEN,SPRINTEC,PREVIFEM) 0.25-35 MG-MCG tablet    Sig: Take 1 tablet by mouth daily.    Dispense:  3 Package    Refill:  3    Howard PouchLauren Melessa Cowell, MD,MS,  PGY2 09/28/2017 11:01 AM

## 2018-04-05 ENCOUNTER — Ambulatory Visit: Payer: Medicaid Other | Admitting: Family Medicine

## 2018-04-05 ENCOUNTER — Encounter: Payer: Self-pay | Admitting: Family Medicine

## 2018-04-05 VITALS — BP 92/72 | HR 97 | Temp 98.0°F | Wt 98.1 lb

## 2018-04-05 DIAGNOSIS — Z114 Encounter for screening for human immunodeficiency virus [HIV]: Secondary | ICD-10-CM

## 2018-04-05 DIAGNOSIS — R5383 Other fatigue: Secondary | ICD-10-CM | POA: Diagnosis present

## 2018-04-05 DIAGNOSIS — E440 Moderate protein-calorie malnutrition: Secondary | ICD-10-CM

## 2018-04-05 DIAGNOSIS — R634 Abnormal weight loss: Secondary | ICD-10-CM

## 2018-04-05 DIAGNOSIS — I959 Hypotension, unspecified: Secondary | ICD-10-CM

## 2018-04-05 DIAGNOSIS — E559 Vitamin D deficiency, unspecified: Secondary | ICD-10-CM | POA: Diagnosis not present

## 2018-04-05 LAB — POCT URINE PREGNANCY: Preg Test, Ur: NEGATIVE

## 2018-04-05 NOTE — Patient Instructions (Signed)
You BP is a bit low. It might be due to poor hydration or poor intake. Please rest at home and keep well hydrated. We have done some blood work. I will call you with the result. Start multivitamin to stimulate your appetite. Go to the ED if symptoms persists.   Fatigue If you have fatigue, you feel tired all the time and have a lack of energy or a lack of motivation. Fatigue may make it difficult to start or complete tasks because of exhaustion. In general, occasional or mild fatigue is often a normal response to activity or life. However, long-lasting (chronic) or extreme fatigue may be a symptom of a medical condition. Follow these instructions at home: General instructions  Watch your fatigue for any changes.  Go to bed and get up at the same time every day.  Avoid fatigue by pacing yourself during the day and getting enough sleep at night.  Maintain a healthy weight. Medicines  Take over-the-counter and prescription medicines only as told by your health care provider.  Take a multivitamin, if told by your health care provider.  Do not use herbal or dietary supplements unless they are approved by your health care provider. Activity   Exercise regularly, as told by your health care provider.  Use or practice techniques to help you relax, such as yoga, tai chi, meditation, or massage therapy. Eating and drinking   Avoid heavy meals in the evening.  Eat a well-balanced diet, which includes lean proteins, whole grains, plenty of fruits and vegetables, and low-fat dairy products.  Avoid consuming too much caffeine.  Avoid the use of alcohol.  Drink enough fluid to keep your urine pale yellow. Lifestyle  Change situations that cause you stress. Try to keep your work and personal schedule in balance.  Do not use any products that contain nicotine or tobacco, such as cigarettes and e-cigarettes. If you need help quitting, ask your health care provider.  Do not use  drugs. Contact a health care provider if:  Your fatigue does not get better.  You have a fever.  You suddenly lose or gain weight.  You have headaches.  You have trouble falling asleep or sleeping through the night.  You feel angry, guilty, anxious, or sad.  You are unable to have a bowel movement (constipation).  Your skin is dry.  You have swelling in your legs or another part of your body. Get help right away if:  You feel confused.  Your vision is blurry.  You feel faint or you pass out.  You have a severe headache.  You have severe pain in your abdomen, your back, or the area between your waist and hips (pelvis).  You have chest pain, shortness of breath, or an irregular or fast heartbeat.  You are unable to urinate, or you urinate less than normal.  You have abnormal bleeding, such as bleeding from the rectum, vagina, nose, lungs, or nipples.  You vomit blood.  You have thoughts about hurting yourself or others. If you ever feel like you may hurt yourself or others, or have thoughts about taking your own life, get help right away. You can go to your nearest emergency department or call:  Your local emergency services (911 in the [place]  A suicide crisis helpline, such as the [place] at 657-415-7531. This is open 24 hours a day. Summary  If you have fatigue, you feel tired all the time and have a lack of energy or a  motivation.  Fatigue may make it difficult to start or complete tasks because of exhaustion.  Long-lasting (chronic) or extreme fatigue may be a symptom of a medical condition.  Exercise regularly, as told by your health care provider.  Change situations that cause you stress. Try to keep your work and personal schedule in balance. This information is not intended to replace advice given to you by your health care provider. Make sure you discuss any questions you have with your health care provider. Document Released:  12/28/2006 Document Revised: 11/25/2016 Document Reviewed: 11/25/2016 Elsevier Interactive Patient Education  2019 [employer]  

## 2018-04-05 NOTE — Progress Notes (Signed)
pc urine Subjective:     Patient ID: Natalie [name], female   DOB: Sep 12, 1985, 33 y.o.   MRN: [id number]  Dizziness  This is a new problem. Episode onset: she started with fever of 101 4 days ago x 2 days. Dizziness, headache and palpitation started yesterday morning. The problem occurs intermittently (Feels well now without dizziness. ). The problem has been waxing and waning. Associated symptoms include congestion, coughing, fatigue and headaches. Pertinent negatives include no change in bowel habit, nausea, sore throat, urinary symptoms or vomiting. Associated symptoms comments: Blurry vision. She had chest pain yesterday but not today. No fever lately. The symptoms are aggravated by standing and bending (Dizziness worsen with movement or standing). Treatments tried: Advil for pain and fever. The treatment provided no relief.   Her daughters were sick 1 week ago with viral illness and she thought she got what they had. Stress a lot and cry a lot. She has lose weight in the last few weeks. LMP: 3 weeks ago, not on birth control. Her ex-husband stated that the last time she felt that way, her vitamin D level was low.  Current Outpatient Medications on File Prior to Visit  Medication Sig Dispense Refill  . norgestimate-ethinyl estradiol (ORTHO-CYCLEN,SPRINTEC,PREVIFEM) 0.25-35 MG-MCG tablet Take 1 tablet by mouth daily. (Patient not taking: Reported on 04/05/2018) 3 Package 3   No current facility-administered medications on file prior to visit.    Past Medical History:  Diagnosis Date  . No pertinent past medical history   . Normal pregnancy, repeat 07/01/2011  . S/P laparoscopy 02/20/2015  . S/P laparoscopy 02/20/2015  . SVD (spontaneous vaginal delivery) 07/02/2011  . SVD (spontaneous vaginal delivery) 09/10/2016   Vitals:   04/05/18 0922 04/05/18 0941 04/05/18 0942  BP: 90/72 92/60 92/72   Pulse: 97    Temp: 98 F (36.7 C)    TempSrc: Oral    SpO2: 98%    Weight: 98 lb 2 oz (44.5 kg)        Review of Systems  Constitutional: Positive for fatigue.  HENT: Positive for congestion. Negative for sore throat.   Respiratory: Positive for cough.   Gastrointestinal: Negative for change in bowel habit, nausea and vomiting.  Neurological: Positive for dizziness and headaches.  All other systems reviewed and are negative.      Objective:   Physical Exam Vitals signs and nursing note reviewed.  Constitutional:      General: She is not in acute distress.    Appearance: She is not toxic-appearing.     Comments: Cachetic   Cardiovascular:     Rate and Rhythm: Normal rate and regular rhythm.     Pulses: Normal pulses.     Heart sounds: Normal heart sounds. No murmur.  Pulmonary:     Effort: Pulmonary effort is normal. No respiratory distress.     Breath sounds: Normal breath sounds. No wheezing.  Abdominal:     General: Abdomen is flat. Bowel sounds are normal. There is no distension.     Palpations: Abdomen is soft.     Tenderness: There is no abdominal tenderness.  Skin:    General: Skin is warm.  Neurological:     General: No focal deficit present.     Mental Status: She is alert and oriented to person, place, and time.     Cranial Nerves: Cranial nerves are intact.     Sensory: Sensation is intact.     Motor: Motor function is intact.     Coordination: Coordination  is intact.     Deep Tendon Reflexes: Reflexes are normal and symmetric.  Psychiatric:        Thought Content: Thought content does not include homicidal or suicidal ideation. Thought content does not include homicidal or suicidal plan.     Comments: Endorsed being save at home. No form of abuse.        Assessment:     Dizziness Fatigue Headache Weight loss: Malnutrition Vitamin D deficiency    Plan:     Dizziness and fatigue is likely related to low BP from poor oral intake. Orthostatic vitals checked by me was negative. Keep well hydrated and rest at home. ED precaution discussed. Bmet,  TSH, CBC checked today. Upreg negative. I will contact her with the result.  Headache may be stress related. No neurologic deficit. Use Tylenol as needed for pain.  Weight loss. Likely nutritional. Start MVI. HIV and TSH checked. Monitor weight closely. See PCP in about 2-4 weeks. ED precaution discussed.  Check vitamin D level.

## 2018-04-06 ENCOUNTER — Other Ambulatory Visit: Payer: Self-pay | Admitting: Family Medicine

## 2018-04-06 ENCOUNTER — Telehealth: Payer: Self-pay | Admitting: Family Medicine

## 2018-04-06 LAB — CBC
Hematocrit: 36 % (ref 34.0–46.6)
Hemoglobin: 12.4 g/dL (ref 11.1–15.9)
MCH: 30.2 pg (ref 26.6–33.0)
MCHC: 34.4 g/dL (ref 31.5–35.7)
MCV: 88 fL (ref 79–97)
Platelets: 186 10*3/uL (ref 150–450)
RBC: 4.11 x10E6/uL (ref 3.77–5.28)
RDW: 12 % (ref 11.7–15.4)
WBC: 9.2 10*3/uL (ref 3.4–10.8)

## 2018-04-06 LAB — HIV ANTIBODY (ROUTINE TESTING W REFLEX): HIV Screen 4th Generation wRfx: NONREACTIVE

## 2018-04-06 LAB — BASIC METABOLIC PANEL
BUN/Creatinine Ratio: 15 (ref 9–23)
BUN: 9 mg/dL (ref 6–20)
CO2: 23 mmol/L (ref 20–29)
Calcium: 9.2 mg/dL (ref 8.7–10.2)
Chloride: 101 mmol/L (ref 96–106)
Creatinine, Ser: 0.6 mg/dL (ref 0.57–1.00)
GFR calc Af Amer: 139 mL/min/{1.73_m2} (ref >59)
GFR calc non Af Amer: 121 mL/min/{1.73_m2} (ref >59)
Glucose: 83 mg/dL (ref 65–99)
Potassium: 4.2 mmol/L (ref 3.5–5.2)
Sodium: 139 mmol/L (ref 134–144)

## 2018-04-06 LAB — TSH: TSH: 2.37 u[IU]/mL (ref 0.450–4.500)

## 2018-04-06 LAB — VITAMIN D 25 HYDROXY (VIT D DEFICIENCY, FRACTURES): Vit D, 25-Hydroxy: 25.3 ng/mL — ABNORMAL LOW (ref 30.0–100.0)

## 2018-04-06 MED ORDER — VITAMIN D (ERGOCALCIFEROL) 1.25 MG (50000 UNIT) PO CAPS: 50000.0000 [IU] | ORAL_CAPSULE | ORAL | 0 refills | Status: DC

## 2018-04-06 NOTE — Telephone Encounter (Signed)
I was unable to reach her from her home and cell phone. HIPAA compliant callback message left.   Note: When she calls back, please let her know that all labs are normal except for mildly low Vitamin D level.  I escribed vitamin D to her pharmacy for 4 weeks. She need repeat check in 4 weeks.   ED precaution as previously discussed.

## 2018-04-08 NOTE — Telephone Encounter (Signed)
Patient aware. Kathie Posa, RN ([facility] RN)  

## 2018-04-15 ENCOUNTER — Other Ambulatory Visit: Payer: Self-pay

## 2018-04-15 ENCOUNTER — Ambulatory Visit: Payer: Medicaid Other | Admitting: Family Medicine

## 2018-04-15 ENCOUNTER — Encounter: Payer: Self-pay | Admitting: Family Medicine

## 2018-04-15 VITALS — BP 94/60 | HR 77 | Temp 97.8°F | Ht 63.0 in | Wt 97.6 lb

## 2018-04-15 DIAGNOSIS — R634 Abnormal weight loss: Secondary | ICD-10-CM | POA: Diagnosis not present

## 2018-04-15 DIAGNOSIS — R040 Epistaxis: Secondary | ICD-10-CM

## 2018-04-15 HISTORY — DX: Epistaxis: R04.0

## 2018-04-15 NOTE — Assessment & Plan Note (Signed)
Patient's baseline weight does appear to be between 100 and 110 pounds, and she is 97 pounds today.  Told patient that she is slightly under a healthy BMI, but this is hopefully due to her lack of appetite recently with her URI.  Patient agreed and does want to maintain a weight greater than 100 pounds.  Gave patient a handout with high-protein and high-calorie foods to add to her diet so that she can regain some weight.  I am not currently worried about an eating disorder, but I could not fully evaluate this today.

## 2018-04-15 NOTE — Assessment & Plan Note (Signed)
>>  ASSESSMENT AND PLAN FOR WEIGHT LOSS, UNINTENTIONAL  POSTPRANDIAL ABDOMINAL PAIN WRITTEN ON 04/15/2018  9:32 AM BY WINFREY, AMANDA C, MD  Patient's baseline weight does appear to be between 100 and 110 pounds, and she is 97 pounds today.  Told patient that she is slightly under a healthy BMI, but this is hopefully due to her lack of appetite recently with her URI.  Patient agreed and does want to maintain a weight greater than 100 pounds.  Gave patient a handout with high-protein and high-calorie foods to add to her diet so that she can regain some weight.  I am not currently worried about an eating disorder, but I could not fully evaluate this today.

## 2018-04-15 NOTE — Progress Notes (Signed)
   Subjective:    Natalie [name] - 33 y.o. female MRN [id number]  Date of birth: 1985-08-31  CC:  Javon Riecke is here for epistaxis.  She would also like to discuss her recent weight loss.  HPI: Epistaxis - noticed on Saturday morning, then again Sunday and Monday - has been recovering from a recent URI and has been blowing her nose more than normal - saw blood on the tissue when she blew her nose, but it did not bleed on its own - is wondering if this could have been caused by the vitamin D that she took on Friday  Recent weight loss -Has had a diminished appetite while having a URI -Has always been "little," and does not usually eat dinner because she feels sick to her stomach when she sleeps if she eats a meal late in the day -Her mother has always been concerned about her low body weight and has wanted her to gain weight for a while -Says that her appetite has returned since she has recovered from her URI -Notices that she is down to 97 pounds today, and says that she is normally in the mid 100s and would like to stay above 100 pounds  Health Maintenance:  Health Maintenance Due  Topic Date Due  . TETANUS/TDAP  04/02/2005  . PAP SMEAR-Modifier  04/03/2007    -  reports that she has never smoked. She has never used smokeless tobacco. - Review of Systems: Per HPI. - Past Medical History: Patient Active Problem List   Diagnosis Date Noted  . Epistaxis 04/15/2018  . Weight loss 04/15/2018  . Contraception management 05/26/2017  . S/P laparoscopy 02/20/2015   - Medications: reviewed and updated   Objective:   Physical Exam BP 94/60   Pulse 77   Temp 97.8 F (36.6 C) (Oral)   Ht 5\' 3"  (1.6 m)   Wt 97 lb 9.6 oz (44.3 kg)   LMP 03/16/2018   SpO2 99%   BMI 17.29 kg/m  Gen: NAD, alert, cooperative with exam, well-appearing, thin HEENT: NCAT, PERRL, clear conjunctiva, nares appear normal bilaterally CV: RRR, good S1/S2, no murmur, no edema Resp: CTABL, no wheezes,  non-labored Psych: good insight, alert and oriented, appropriate mood and affect        Assessment & Plan:   Epistaxis Advised patient that her epistaxis is likely from her frequent nose blowing and the dry air during winter.  Also told her that I was less concerned about her epistaxis because she did not have frank epistaxis but rather a small amount on the tissue paper when she would blow her nose.  Reassured her that this is not caused by vitamin D and encouraged her to continue taking this medication.  Patient was in agreement with this plan.  Weight loss Patient's baseline weight does appear to be between 100 and 110 pounds, and she is 97 pounds today.  Told patient that she is slightly under a healthy BMI, but this is hopefully due to her lack of appetite recently with her URI.  Patient agreed and does want to maintain a weight greater than 100 pounds.  Gave patient a handout with high-protein and high-calorie foods to add to her diet so that she can regain some weight.  I am not currently worried about an eating disorder, but I could not fully evaluate this today.    Lezlie Octave, M.D. 04/15/2018, 9:32 AM PGY-2, [facility]

## 2018-04-15 NOTE — Assessment & Plan Note (Signed)
Advised patient that her epistaxis is likely from her frequent nose blowing and the dry air during winter.  Also told her that I was less concerned about her epistaxis because she did not have frank epistaxis but rather a small amount on the tissue paper when she would blow her nose.  Reassured her that this is not caused by vitamin D and encouraged her to continue taking this medication.  Patient was in agreement with this plan.

## 2018-04-15 NOTE — Patient Instructions (Addendum)
It was nice meeting you today Natalie [name]!  Your nosebleeds are nothing to worry about and should improve as you blow your nose less and recover from your cold.  I have included tips for adding higher protein and higher calorie foods to your diet so that you can maintain a healthy weight.  You are still within the healthy range, but I would like for you not to lose anymore weight if possible.  Please let me know if you have any questions about this.  If you have any questions or concerns, please feel free to call the clinic.   Be well,  Dr. Frances Furbish  High-Protein and High-Calorie Diet Eating high-protein and high-calorie foods can help you to gain weight, heal after an injury, and recover after an illness or surgery. The specific amount of daily protein and calories you need depends on:  Your body weight.  The reason this diet is recommended for you. What is my plan? Generally, a high-protein, high-calorie diet involves:  Eating 250-500 extra calories each day.  Making sure that you get enough of your daily calories from protein. Ask your health care provider how many of your calories should come from protein. Talk with a health care provider, such as a diet and nutrition specialist (dietitian), about how much protein and how many calories you need each day. Follow the diet as directed by your health care provider. What are tips for following this plan?  Preparing meals  Add whole milk, half-and-half, or heavy cream to cereal, pudding, soup, or hot cocoa.  Add whole milk to instant breakfast drinks.  Add peanut butter to oatmeal or smoothies.  Add powdered milk to baked goods, smoothies, or milkshakes.  Add powdered milk, cream, or butter to mashed potatoes.  Add cheese to cooked vegetables.  Make whole-milk yogurt parfaits. Top them with granola, fruit, or nuts.  Add cottage cheese to your fruit.  Add avocado, cheese, or both to sandwiches or salads.  Add meat, poultry,  or seafood to rice, pasta, casseroles, salads, and soups.  Use mayonnaise when making egg salad, chicken salad, or tuna salad.  Use peanut butter as a dip for vegetables or as a topping for pretzels, celery, or crackers.  Add beans to casseroles, dips, and spreads.  Add pureed beans to sauces and soups.  Replace calorie-free drinks with calorie-containing drinks, such as milk and fruit juice.  Replace water with milk or heavy cream when making foods such as oatmeal, pudding, or cocoa. General instructions  Ask your health care provider if you should take a nutritional supplement.  Try to eat six small meals each day instead of three large meals.  Eat a balanced diet. In each meal, include one food that is high in protein.  Keep nutritious snacks available, such as nuts, trail mixes, dried fruit, and yogurt.  If you have kidney disease or diabetes, talk with your health care provider about how much protein is safe for you. Too much protein may put extra stress on your kidneys.  Drink your calories. Choose high-calorie drinks and have them after your meals. What high-protein foods should I eat?  Vegetables Soybeans. Peas. Grains Quinoa. Bulgur wheat. Meats and other proteins Beef, pork, and poultry. Fish and seafood. Eggs. Tofu. Textured vegetable protein (TVP). Peanut butter. Nuts and seeds. Dried beans. Protein powders. Dairy Whole milk. Whole-milk yogurt. Powdered milk. Cheese. [employer]. Eggnog. Beverages High-protein supplement drinks. Soy milk. Other foods Protein bars. The items listed above may not be a  complete list of high-protein foods and beverages. Contact a dietitian for more options. What high-calorie foods should I eat? Fruits Dried fruit. Fruit leather. Canned fruit in syrup. Fruit juice. Avocado. Vegetables Vegetables cooked in oil or butter. Fried potatoes. Grains Pasta. Quick breads. Muffins. Pancakes. Ready-to-eat cereal. Meats and other  proteins Peanut butter. Nuts and seeds. Dairy Heavy cream. Whipped cream. Cream cheese. Sour cream. Ice cream. Custard. Pudding. Beverages Meal-replacement beverages. Nutrition shakes. Fruit juice. Sugar-sweetened soft drinks. Seasonings and condiments Salad dressing. Mayonnaise. Alfredo sauce. Fruit preserves or jelly. Honey. Syrup. Sweets and desserts Cake. Cookies. Pie. Pastries. Candy bars. Chocolate. Fats and oils Butter or margarine. Oil. Gravy. Other foods Meal-replacement bars. The items listed above may not be a complete list of high-calorie foods and beverages. Contact a dietitian for more options. Summary  A high-protein, high-calorie diet can help you gain weight or heal faster after an injury, illness, or surgery.  To increase your protein and calories, add ingredients such as whole milk, peanut butter, cheese, beans, meat, or seafood to meal items.  To get enough extra calories each day, include high-calorie foods and beverages at each meal.  Adding a high-calorie drink or shake can be an easy way to help you get enough calories each day. Talk with your healthcare provider or dietitian about the best options for you. This information is not intended to replace advice given to you by your health care provider. Make sure you discuss any questions you have with your health care provider. Document Released: 03/02/2005 Document Revised: 01/12/2017 Document Reviewed: 01/12/2017 Elsevier Interactive Patient Education  2019 [employer].

## 2019-11-14 ENCOUNTER — Ambulatory Visit (INDEPENDENT_AMBULATORY_CARE_PROVIDER_SITE_OTHER): Payer: Medicaid Other | Admitting: Family Medicine

## 2019-11-14 ENCOUNTER — Other Ambulatory Visit: Payer: Self-pay

## 2019-11-14 ENCOUNTER — Encounter: Payer: Self-pay | Admitting: Family Medicine

## 2019-11-14 VITALS — BP 96/58 | HR 89 | Ht 62.5 in | Wt 105.4 lb

## 2019-11-14 DIAGNOSIS — Z0000 Encounter for general adult medical examination without abnormal findings: Secondary | ICD-10-CM | POA: Diagnosis not present

## 2019-11-14 DIAGNOSIS — Z30011 Encounter for initial prescription of contraceptive pills: Secondary | ICD-10-CM

## 2019-11-14 LAB — POCT URINE PREGNANCY: Preg Test, Ur: NEGATIVE

## 2019-11-14 MED ORDER — NORGESTIM-ETH ESTRAD TRIPHASIC 0.18/0.215/0.25 MG-25 MCG PO TABS: 1.0000 | ORAL_TABLET | Freq: Every day | ORAL | 11 refills | Status: DC

## 2019-11-14 NOTE — Progress Notes (Signed)
SUBJECTIVE:   Chief compliant/HPI: annual examination  Natalie [name] is a 34 y.o. who presents today for an annual exam.   Review of systems form notable for dizziness upon standing.  Review of Systems  Constitutional: Negative for chills, fever and weight loss.  HENT: Negative for congestion, hearing loss, sinus pain and sore throat.   Eyes: Negative for blurred vision.  Respiratory: Negative for cough, sputum production and shortness of breath.   Cardiovascular: Negative for chest pain, palpitations and leg swelling.  Gastrointestinal: Negative for blood in stool, constipation, diarrhea, nausea and vomiting.  Genitourinary: Negative for dysuria and hematuria.  Musculoskeletal: Negative for myalgias.  Skin: Negative for rash.  Neurological: Positive for dizziness. Negative for weakness and headaches.  Endo/Heme/Allergies: Does not bruise/bleed easily.  Psychiatric/Behavioral: Negative for depression and suicidal ideas.   Updated history tabs and problem list   OBJECTIVE:   BP (!) 96/58   Pulse 89   Ht 5' 2.5" (1.588 m)   Wt 105 lb 6.4 oz (47.8 kg)   LMP 11/01/2019 (Exact Date)   SpO2 98%   BMI 18.97 kg/m   Physical Exam Constitutional:      General: She is not in acute distress.    Appearance: Normal appearance. She is normal weight. She is not ill-appearing.  HENT:     Head: Normocephalic and atraumatic.     Nose: Nose normal. No congestion or rhinorrhea.     Mouth/Throat:     Mouth: Mucous membranes are moist.     Pharynx: Oropharynx is clear. No oropharyngeal exudate or posterior oropharyngeal erythema.  Eyes:     Extraocular Movements: Extraocular movements intact.     Conjunctiva/sclera: Conjunctivae normal.     Pupils: Pupils are equal, round, and reactive to light.  Cardiovascular:     Rate and Rhythm: Normal rate and regular rhythm.     Pulses: Normal pulses.     Heart sounds: Normal heart sounds. No murmur heard.   Pulmonary:     Effort:  Pulmonary effort is normal. No respiratory distress.     Breath sounds: Normal breath sounds.  Abdominal:     General: Abdomen is flat. Bowel sounds are normal. There is no distension.     Palpations: Abdomen is soft.     Tenderness: There is no abdominal tenderness.  Musculoskeletal:        General: No swelling or tenderness. Normal range of motion.     Cervical back: Normal range of motion and neck supple. No rigidity or tenderness.  Skin:    General: Skin is warm and dry.     Capillary Refill: Capillary refill takes less than 2 seconds.     Coloration: Skin is not jaundiced.     Findings: No bruising.  Neurological:     General: No focal deficit present.     Mental Status: She is alert and oriented to person, place, and time.     Cranial Nerves: No cranial nerve deficit.     Sensory: No sensory deficit.  Psychiatric:        Mood and Affect: Mood normal.        Behavior: Behavior normal.        Thought Content: Thought content normal.        Judgment: Judgment normal.      ASSESSMENT/PLAN:   Encounter for wellness examination in adult No concerns during this wellness exam.  Discussed hepatitis C screening as well as Pap smear.  Patient does not wish to  get hepatitis C screening at this time.  She reports her Pap smear was completed by her gynecologist and she will get those results.  Patient did wish to start a birth control and asked that there is a lowered hormone content birth control in the previous medication she is prescribed. -Prescribed low-dose Sprintec -Follow-up in 1 year for annual health exam    Annual Examination  See AVS for age appropriate recommendations.   PHQ score 0, reviewed and discussed. Blood pressure reviewed and at goal is mildly low.  Asked about intimate partner violence and patient reports none.  The patient currently does not use contraceptives but would like OCPs. Folate recommended as appropriate, minimum of 400 mcg per day.      Considered the following items based upon USPSTF recommendations: HIV testing: Discussed Hepatitis C: discussed Hepatitis B: discussed Syphilis if at high risk: low risk  GC/CT not at high risk and not ordered. Lipid panel (nonfasting or fasting) discussed based upon AHA recommendations and not ordered.  Consider repeat every 4-6 years.  Reviewed risk factors for latent tuberculosis and not indicated  Discussed family history, BRCA testing not indicated.  Cervical cancer screening: Reportedly completed by OB   Follow up in 1 year or sooner if indicated.    Gifford Shave, MD [place]

## 2019-11-14 NOTE — Patient Instructions (Addendum)
It was a pleasure to meet you today!  I am glad you are doing well.  We have provided a list of the dentist and optometrist that accepts your insurance.  I also started you on a lower dose birth control which I sent to your pharmacy.  If you have any questions or concerns please feel free to call the clinic.  If not we will see you in 1 year for another wellness visit.  Happy have a wonderful afternoon!

## 2019-11-15 DIAGNOSIS — Z0000 Encounter for general adult medical examination without abnormal findings: Secondary | ICD-10-CM | POA: Insufficient documentation

## 2019-11-15 HISTORY — DX: Encounter for general adult medical examination without abnormal findings: Z00.00

## 2019-11-15 NOTE — Assessment & Plan Note (Signed)
No concerns during this wellness exam.  Discussed hepatitis C screening as well as Pap smear.  Patient does not wish to get hepatitis C screening at this time.  She reports her Pap smear was completed by her gynecologist and she will get those results.  Patient did wish to start a birth control and asked that there is a lowered hormone content birth control in the previous medication she is prescribed. -Prescribed low-dose Sprintec -Follow-up in 1 year for annual health exam

## 2020-04-08 ENCOUNTER — Encounter (HOSPITAL_COMMUNITY): Payer: Self-pay | Admitting: *Deleted

## 2020-04-08 ENCOUNTER — Inpatient Hospital Stay (HOSPITAL_COMMUNITY)
Admission: EM | Admit: 2020-04-08 | Discharge: 2020-04-16 | DRG: 373 | Disposition: A | Discharge status: Home / Self Care | Payer: BC Managed Care – PPO | Attending: General Surgery | Admitting: General Surgery

## 2020-04-08 ENCOUNTER — Encounter (HOSPITAL_COMMUNITY): Payer: Self-pay

## 2020-04-08 ENCOUNTER — Emergency Department (HOSPITAL_COMMUNITY): Payer: BC Managed Care – PPO

## 2020-04-08 ENCOUNTER — Ambulatory Visit (HOSPITAL_COMMUNITY)
Admission: EM | Admit: 2020-04-08 | Discharge: 2020-04-08 | Disposition: A | Discharge status: Home / Self Care | Payer: Medicaid Other | Attending: Family Medicine | Admitting: Family Medicine

## 2020-04-08 ENCOUNTER — Other Ambulatory Visit: Payer: Self-pay

## 2020-04-08 DIAGNOSIS — R1031 Right lower quadrant pain: Secondary | ICD-10-CM | POA: Diagnosis not present

## 2020-04-08 DIAGNOSIS — Z79899 Other long term (current) drug therapy: Secondary | ICD-10-CM

## 2020-04-08 DIAGNOSIS — Z20822 Contact with and (suspected) exposure to covid-19: Secondary | ICD-10-CM | POA: Diagnosis present

## 2020-04-08 DIAGNOSIS — B9620 Unspecified Escherichia coli [E. coli] as the cause of diseases classified elsewhere: Secondary | ICD-10-CM | POA: Diagnosis present

## 2020-04-08 DIAGNOSIS — D649 Anemia, unspecified: Secondary | ICD-10-CM | POA: Diagnosis present

## 2020-04-08 DIAGNOSIS — Z793 Long term (current) use of hormonal contraceptives: Secondary | ICD-10-CM

## 2020-04-08 DIAGNOSIS — R197 Diarrhea, unspecified: Secondary | ICD-10-CM | POA: Diagnosis not present

## 2020-04-08 DIAGNOSIS — R001 Bradycardia, unspecified: Secondary | ICD-10-CM | POA: Diagnosis not present

## 2020-04-08 DIAGNOSIS — K3532 Acute appendicitis with perforation and localized peritonitis, without abscess: Secondary | ICD-10-CM | POA: Diagnosis present

## 2020-04-08 DIAGNOSIS — K651 Peritoneal abscess: Secondary | ICD-10-CM

## 2020-04-08 DIAGNOSIS — K3533 Acute appendicitis with perforation and localized peritonitis, with abscess: Principal | ICD-10-CM | POA: Diagnosis present

## 2020-04-08 DIAGNOSIS — K37 Unspecified appendicitis: Secondary | ICD-10-CM

## 2020-04-08 DIAGNOSIS — R112 Nausea with vomiting, unspecified: Secondary | ICD-10-CM

## 2020-04-08 LAB — CBC
HCT: 34.9 % — ABNORMAL LOW (ref 36.0–46.0)
Hemoglobin: 11 g/dL — ABNORMAL LOW (ref 12.0–15.0)
MCH: 29.7 pg (ref 26.0–34.0)
MCHC: 31.5 g/dL (ref 30.0–36.0)
MCV: 94.3 fL (ref 80.0–100.0)
Platelets: 232 10*3/uL (ref 150–400)
RBC: 3.7 MIL/uL — ABNORMAL LOW (ref 3.87–5.11)
RDW: 11.9 % (ref 11.5–15.5)
WBC: 18.4 10*3/uL — ABNORMAL HIGH (ref 4.0–10.5)
nRBC: 0 % (ref 0.0–0.2)

## 2020-04-08 LAB — COMPREHENSIVE METABOLIC PANEL
ALT: 17 U/L (ref 0–44)
AST: 13 U/L — ABNORMAL LOW (ref 15–41)
Albumin: 3.4 g/dL — ABNORMAL LOW (ref 3.5–5.0)
Alkaline Phosphatase: 65 U/L (ref 38–126)
Anion gap: 14 (ref 5–15)
BUN: 9 mg/dL (ref 6–20)
CO2: 21 mmol/L — ABNORMAL LOW (ref 22–32)
Calcium: 9 mg/dL (ref 8.9–10.3)
Chloride: 100 mmol/L (ref 98–111)
Creatinine, Ser: 0.63 mg/dL (ref 0.44–1.00)
GFR, Estimated: >60 mL/min (ref >60)
Glucose, Bld: 98 mg/dL (ref 70–99)
Potassium: 3.9 mmol/L (ref 3.5–5.1)
Sodium: 135 mmol/L (ref 135–145)
Total Bilirubin: 0.6 mg/dL (ref 0.3–1.2)
Total Protein: 8.3 g/dL — ABNORMAL HIGH (ref 6.5–8.1)

## 2020-04-08 LAB — POCT URINALYSIS DIPSTICK, ED / UC
Bilirubin Urine: NEGATIVE
Glucose, UA: NEGATIVE mg/dL
Ketones, ur: 15 mg/dL — AB
Nitrite: NEGATIVE
Protein, ur: NEGATIVE mg/dL
Specific Gravity, Urine: 1.025 (ref 1.005–1.030)
Urobilinogen, UA: 0.2 mg/dL (ref 0.0–1.0)
pH: 5.5 (ref 5.0–8.0)

## 2020-04-08 LAB — POC URINE PREG, ED: Preg Test, Ur: NEGATIVE

## 2020-04-08 LAB — LIPASE, BLOOD: Lipase: 29 U/L (ref 11–51)

## 2020-04-08 LAB — URINALYSIS, ROUTINE W REFLEX MICROSCOPIC
Bilirubin Urine: NEGATIVE
Glucose, UA: NEGATIVE mg/dL
Ketones, ur: 20 mg/dL — AB
Nitrite: NEGATIVE
Protein, ur: NEGATIVE mg/dL
Specific Gravity, Urine: 1.018 (ref 1.005–1.030)
pH: 5 (ref 5.0–8.0)

## 2020-04-08 LAB — I-STAT BETA HCG BLOOD, ED (MC, WL, AP ONLY): I-stat hCG, quantitative: 6.3 m[IU]/mL — ABNORMAL HIGH (ref <5)

## 2020-04-08 LAB — LACTIC ACID, PLASMA: Lactic Acid, Venous: 0.8 mmol/L (ref 0.5–1.9)

## 2020-04-08 LAB — SARS CORONAVIRUS 2 BY RT PCR (HOSPITAL ORDER, PERFORMED IN ~~LOC~~ HOSPITAL LAB): SARS Coronavirus 2: NEGATIVE

## 2020-04-08 MED ORDER — IOHEXOL 300 MG/ML  SOLN
100.0000 mL | Freq: Once | INTRAMUSCULAR | Status: AC | PRN
  Administered 2020-04-08: 100 mL via INTRAVENOUS

## 2020-04-08 MED ORDER — SODIUM CHLORIDE 0.9 % IV BOLUS
1000.0000 mL | Freq: Once | INTRAVENOUS | Status: AC
  Administered 2020-04-08: 1000 mL via INTRAVENOUS

## 2020-04-08 MED ORDER — FENTANYL CITRATE (PF) 100 MCG/2ML IJ SOLN
50.0000 ug | Freq: Once | INTRAMUSCULAR | Status: AC
  Administered 2020-04-08: 50 ug via INTRAVENOUS
  Filled 2020-04-08: qty 2

## 2020-04-08 MED ORDER — ACETAMINOPHEN 325 MG PO TABS
650.0000 mg | ORAL_TABLET | Freq: Once | ORAL | Status: AC | PRN
  Administered 2020-04-08: 650 mg via ORAL
  Filled 2020-04-08: qty 2

## 2020-04-08 MED ORDER — ONDANSETRON HCL 4 MG/2ML IJ SOLN
4.0000 mg | Freq: Once | INTRAMUSCULAR | Status: AC
  Administered 2020-04-08: 4 mg via INTRAVENOUS
  Filled 2020-04-08: qty 2

## 2020-04-08 MED ORDER — PIPERACILLIN-TAZOBACTAM 3.375 G IVPB
3.3750 g | Freq: Three times a day (TID) | INTRAVENOUS | Status: DC
  Administered 2020-04-09 – 2020-04-14 (×16): 3.375 g via INTRAVENOUS
  Filled 2020-04-08 (×16): qty 50

## 2020-04-08 MED ORDER — PIPERACILLIN-TAZOBACTAM 3.375 G IVPB 30 MIN
3.3750 g | Freq: Once | INTRAVENOUS | Status: AC
  Administered 2020-04-08: 3.375 g via INTRAVENOUS
  Filled 2020-04-08: qty 50

## 2020-04-08 NOTE — Progress Notes (Signed)
Pharmacy Antibiotic Note  Natalie [name] is a 35 y.o. female admitted on 04/08/2020 with sepsis.  Pharmacy has been consulted for zosyn dosing.  Plan: Zosyn 3.375g IV q8h (4 hour infusion).  F/u cultures and clinical course  Weight: 72.6 kg (160 lb)  Temp (24hrs), Avg:100 F (37.8 C), Min:99 F (37.2 C), Max:101.6 F (38.7 C)  Recent Labs  Lab 04/08/20 1403 04/08/20 1408  WBC 18.4*  --   CREATININE 0.63  --   LATICACIDVEN  --  0.8    Estimated Creatinine Clearance: 93.5 mL/min (by C-G formula based on SCr of 0.63 mg/dL).    No Known Allergies  Thank you for allowing pharmacy to be a part of this patient's care.  Woodfin Ganja 04/08/2020 10:27 PM

## 2020-04-08 NOTE — ED Notes (Signed)
Patient is being discharged from the Urgent Care and sent to the Emergency Department via POV . Per LG, patient is in need of higher level of care due to flank pain. Patient is aware and verbalizes understanding of plan of care.  Vitals:   04/08/20 1107 04/08/20 1111  BP: (!) 92/50 (!) 94/41  Pulse: 96   Resp: 20   Temp: 99 F (37.2 C)   SpO2: 100%
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
## 2020-04-08 NOTE — ED Triage Notes (Signed)
Pt c/o of bilateral flank pain x 2 days. Pt states the pain has gotten worse that is causes her to become weak. She states the pain has radiated to her umbilical area. Pt states she has been having back pain X 5 days. Pt states she felt nausea Sunday and this morning.

## 2020-04-08 NOTE — ED Notes (Signed)
Pts sister Natalie [name] [phone] would like an update

## 2020-04-08 NOTE — Discharge Instructions (Addendum)
-  Head straight to [facility] ED for evaluation of right lower quadrant pain

## 2020-04-08 NOTE — ED Provider Notes (Signed)
[facility] EMERGENCY DEPARTMENT Provider Note   CSN: [id number] Arrival date & time: 04/08/20  1203     History Chief Complaint  Patient presents with  . Abdominal Pain  . Emesis    Natalie [name] is a 35 y.o. female presents for evaluation of abdominal pain, nausea/vomiting that has been ongoing for about a week or so.  She states that the pain was very intense and was sharp.  She states initially started in periumbilical region and then went to the right lower quadrant.  She states that 2 days ago, the pain became very severe and she started having nausea/vomiting.  She has also had some diarrhea.  She reports that she has not been able tolerate much p.o.  She felt like 2 days ago, she had an episode where the pain became intense and then eased up a bit.  She thought she was getting better but then states that the pain returned today.  She also reports she has had fever at home that began today.  She is not any chest pain, difficulty breathing.  She denies any dysuria, hematuria.  She has been vaccinated for Covid.  No known Covid exposure that she knows of.  The history is provided by the patient.       Past Medical History:  Diagnosis Date  . Normal pregnancy, repeat 07/01/2011  . S/P laparoscopy 02/20/2015  . S/P laparoscopy 02/20/2015  . SVD (spontaneous vaginal delivery) 07/02/2011  . SVD (spontaneous vaginal delivery) 09/10/2016    Patient Active Problem List   Diagnosis Date Noted  . Ruptured appendicitis 04/08/2020  . Encounter for wellness examination in adult 11/15/2019  . Epistaxis 04/15/2018  . Weight loss 04/15/2018  . Contraception management 05/26/2017  . S/P laparoscopy 02/20/2015    Past Surgical History:  Procedure Laterality Date  . HYSTEROSCOPY N/A 01/07/2015   Procedure: HYSTEROSCOPY DIAGNOSTIC, ATTEMPTED REMOVAL OF iNTRAUTERINE DEVICE;  Surgeon: Sherian Rein, MD;  Location: [facility];  Service: Gynecology;  Laterality: N/A;  MD  requests 1hr OR time  . IUD REMOVAL N/A 02/20/2015   Procedure: INTRAUTERINE DEVICE (IUD) REMOVAL;  Surgeon: Sherian Rein, MD;  Location: [facility];  Service: Gynecology;  Laterality: N/A;  . LAPAROSCOPY N/A 02/20/2015   Procedure: LAPAROSCOPY OPERATIVE;  Surgeon: Sherian Rein, MD;  Location: [facility];  Service: Gynecology;  Laterality: N/A;  . NO PAST SURGERIES       OB History    Gravida  3   Para  3   Term  3   Preterm  0   AB  0   Living  3     SAB  0   IAB  0   Ectopic  0   Multiple  0   Live Births  3           History reviewed. No pertinent family history.  Social History   Tobacco Use  . Smoking status: Never Smoker  . Smokeless tobacco: Never Used  Substance Use Topics  . Alcohol use: No  . Drug use: No    Home Medications Prior to Admission medications   Medication Sig Start Date End Date Taking? Authorizing Provider  Norgestimate-Ethinyl Estradiol Triphasic 0.18/0.215/0.25 MG-25 MCG tab Take 1 tablet by mouth daily. 11/14/19   Derrel Nip, MD  Vitamin D, Ergocalciferol, (DRISDOL) 1.25 MG (50000 UT) CAPS capsule TAKE 1 CAPSULE BY MOUTH EVERY 7 DAYS 04/07/18   Howard Pouch, MD    Allergies    Patient has no  known allergies.  Review of Systems   Review of Systems  Constitutional: Positive for fever.  Respiratory: Negative for cough and shortness of breath.   Cardiovascular: Negative for chest pain.  Gastrointestinal: Positive for abdominal pain, nausea and vomiting.  Genitourinary: Negative for dysuria and hematuria.  Neurological: Negative for headaches.  All other systems reviewed and are negative.   Physical Exam Updated Vital Signs BP 100/61   Pulse 96   Temp 99.5 F (37.5 C)   Resp 16   Wt 72.6 kg   LMP 04/01/2020 (Approximate)   SpO2 100%   BMI 28.80 kg/m   Physical Exam Vitals and nursing note reviewed.  Constitutional:      Appearance: Normal appearance. She is well-developed and well-nourished.  HENT:      Head: Normocephalic and atraumatic.     Mouth/Throat:     Mouth: Oropharynx is clear and moist and mucous membranes are normal.  Eyes:     General: Lids are normal.     Extraocular Movements: EOM normal.     Conjunctiva/sclera: Conjunctivae normal.     Pupils: Pupils are equal, round, and reactive to light.  Cardiovascular:     Rate and Rhythm: Normal rate and regular rhythm.     Pulses: Normal pulses.     Heart sounds: Normal heart sounds. No murmur heard. No friction rub. No gallop.   Pulmonary:     Effort: Pulmonary effort is normal.     Breath sounds: Normal breath sounds.     Comments: Lungs clear to auscultation bilaterally.  Symmetric chest rise.  No wheezing, rales, rhonchi. Abdominal:     Palpations: Abdomen is soft. Abdomen is not rigid.     Tenderness: There is abdominal tenderness in the right lower quadrant and periumbilical area. There is guarding. There is no right CVA tenderness or left CVA tenderness.     Comments: Abdomen soft, nondistended.  Tenderness palpation apparent below: Right lower quadrant.  She does have some voluntary guarding.  No rigidity. No CVA tenderness noted bilaterally.   Musculoskeletal:        General: Normal range of motion.     Cervical back: Full passive range of motion without pain.  Skin:    General: Skin is warm and dry.     Capillary Refill: Capillary refill takes less than 2 seconds.  Neurological:     Mental Status: She is alert and oriented to person, place, and time.  Psychiatric:        Mood and Affect: Mood and affect normal.        Speech: Speech normal.     ED Results / Procedures / Treatments   Labs (all labs ordered are listed, but only abnormal results are displayed) Labs Reviewed  COMPREHENSIVE METABOLIC PANEL - Abnormal; Notable for the following components:      Result Value   CO2 21 (*)    Total Protein 8.3 (*)    Albumin 3.4 (*)    AST 13 (*)    All other components within normal limits  CBC - Abnormal;  Notable for the following components:   WBC 18.4 (*)    RBC 3.70 (*)    Hemoglobin 11.0 (*)    HCT 34.9 (*)    All other components within normal limits  URINALYSIS, ROUTINE W REFLEX MICROSCOPIC - Abnormal; Notable for the following components:   APPearance HAZY (*)    Hgb urine dipstick MODERATE (*)    Ketones, ur 20 (*)  Leukocytes,Ua LARGE (*)    Bacteria, UA MANY (*)    All other components within normal limits  I-STAT BETA HCG BLOOD, ED ([facility], [facility], [facility] ONLY) - Abnormal; Notable for the following components:   I-stat hCG, quantitative 6.3 (*)    All other components within normal limits  SARS CORONAVIRUS 2 BY RT PCR (HOSPITAL ORDER, PERFORMED IN [facility])  LIPASE, BLOOD  LACTIC ACID, PLASMA    EKG None  Radiology CT ABDOMEN PELVIS W CONTRAST  Result Date: 04/08/2020 CLINICAL DATA:  Abdominal pain EXAM: CT ABDOMEN AND PELVIS WITH CONTRAST TECHNIQUE: Multidetector CT imaging of the abdomen and pelvis was performed using the standard protocol following bolus administration of intravenous contrast. CONTRAST:  100mL OMNIPAQUE IOHEXOL 300 MG/ML  SOLN COMPARISON:  None. FINDINGS: Lower chest: Lung bases are clear. No effusions. Heart is normal size. Hepatobiliary: No focal hepatic abnormality. Gallbladder unremarkable. Pancreas: No focal abnormality or ductal dilatation. Spleen: No focal abnormality.  Normal size. Adrenals/Urinary Tract: No adrenal abnormality. No focal renal abnormality. No stones or hydronephrosis. Urinary bladder is unremarkable. Stomach/Bowel: Complex process seen in the right lower quadrant. There appears to be extraluminal complex fluid collection adjacent to the cecum, but it is difficult to determine what is bowel and what is extraluminal fluid collection without oral contrast. Appearance is suspicious for possible ruptured appendicitis. No evidence of bowel obstruction. Vascular/Lymphatic: No evidence of aneurysm or adenopathy. Reproductive: Uterus and  adnexa unremarkable.  No mass. Other: Small amount of free fluid in the pelvis.  No free air. Musculoskeletal: No acute bony abnormality. IMPRESSION: Complex process in the right lower quadrant adjacent to the cecum concerning for extraluminal fluid collection/abscess, possibly related to ruptured appendicitis, but difficult to separate bowel from fluid collection without oral contrast. These results were called by telephone at the time of interpretation on 04/08/2020 at 9:20 pm to provider JULIE HAVILAND , who verbally acknowledged these results. Electronically Signed   By: Charlett NoseKevin  Dover M.D.   On: 04/08/2020 21:23    Procedures Procedures   Medications Ordered in ED Medications  piperacillin-tazobactam (ZOSYN) IVPB 3.375 g (has no administration in time range)  acetaminophen (TYLENOL) tablet 650 mg (650 mg Oral Given 04/08/20 1403)  iohexol (OMNIPAQUE) 300 MG/ML solution 100 mL (100 mLs Intravenous Contrast Given 04/08/20 2109)  sodium chloride 0.9 % bolus 1,000 mL (1,000 mLs Intravenous New Bag/Given 04/08/20 2232)  ondansetron (ZOFRAN) injection 4 mg (4 mg Intravenous Given 04/08/20 2232)  piperacillin-tazobactam (ZOSYN) IVPB 3.375 g (3.375 g Intravenous New Bag/Given 04/08/20 2232)  fentaNYL (SUBLIMAZE) injection 50 mcg (50 mcg Intravenous Given 04/08/20 2231)    ED Course  I have reviewed the triage vital signs and the nursing notes.  Pertinent labs & imaging results that were available during my care of the patient were reviewed by me and considered in my medical decision making (see chart for details).    MDM Rules/Calculators/A&P                          35 year old female who presents for evaluation of abdominal pain, nausea/vomiting, diarrhea.  States she started having fever and pain worsened prompting ED visit.  On initial arrival, she is febrile.  Vitals otherwise stable.  On exam, she is tender noted focally to the periumbilical and right lower quadrant region.  Concern for  appendicitis.  Also consider other intra-abdominal process.  Plan to check labs.  UA shows moderate hemoglobin, large leukocytes, pyuria.  Question  UTI but she does not have any dysuria or hematuria.  She has no CVA tenderness.  Lactic is normal.  CBC shows leukocytosis of 18.4.  CMP shows normal BUN and creatinine.  CT abd/pelvis shows complex process in the right lower quadrant adjacent to the cecum concerning for extraluminal fluid collection/abscess possibly related to ruptured appendicitis.  Discussed patient with Dr. Janee Morn (Gen Surg) who will come evaluate the patient.   Surgery team will admit the patient.   Portions of this note were generated with Scientist, clinical (histocompatibility and immunogenetics). Dictation errors may occur despite best attempts at proofreading.   Final Clinical Impression(s) / ED Diagnoses Final diagnoses:  Appendicitis, unspecified appendicitis type  Non-intractable vomiting with nausea, unspecified vomiting type    Rx / DC Orders ED Discharge Orders    None       Rosana Hoes 04/08/20 2314    Milagros Loll, MD 04/09/20 [phone]

## 2020-04-08 NOTE — ED Provider Notes (Signed)
[facility]    CSN: [id number] Arrival date & time: 04/08/20  2440      History   Chief Complaint Chief Complaint  Patient presents with  . Abdominal Pain  . Nausea  . Flank Pain    HPI Natalie [name] is a 35 y.o. female presenting with nausea, RLQ abdominal pain, and flank pain R>L. Endorses symptoms for 24 hours, getting worse. Endorses nausea but no vomiting. Subjective chills at home. Denies hematuria, dysuria, frequency, urgency,  fevers/chills, abdnormal vaginal discharge. Last period was 1 week ago. Still has her appendix. Not taking anything for her symptoms.   HPI  Past Medical History:  Diagnosis Date  . Normal pregnancy, repeat 07/01/2011  . S/P laparoscopy 02/20/2015  . S/P laparoscopy 02/20/2015  . SVD (spontaneous vaginal delivery) 07/02/2011  . SVD (spontaneous vaginal delivery) 09/10/2016    Patient Active Problem List   Diagnosis Date Noted  . Encounter for wellness examination in adult 11/15/2019  . Epistaxis 04/15/2018  . Weight loss 04/15/2018  . Contraception management 05/26/2017  . S/P laparoscopy 02/20/2015    Past Surgical History:  Procedure Laterality Date  . HYSTEROSCOPY N/A 01/07/2015   Procedure: HYSTEROSCOPY DIAGNOSTIC, ATTEMPTED REMOVAL OF iNTRAUTERINE DEVICE;  Surgeon: Sherian Rein, MD;  Location: [facility];  Service: Gynecology;  Laterality: N/A;  MD requests 1hr OR time  . IUD REMOVAL N/A 02/20/2015   Procedure: INTRAUTERINE DEVICE (IUD) REMOVAL;  Surgeon: Sherian Rein, MD;  Location: [facility];  Service: Gynecology;  Laterality: N/A;  . LAPAROSCOPY N/A 02/20/2015   Procedure: LAPAROSCOPY OPERATIVE;  Surgeon: Sherian Rein, MD;  Location: [facility];  Service: Gynecology;  Laterality: N/A;  . NO PAST SURGERIES      OB History    Gravida  3   Para  3   Term  3   Preterm  0   AB  0   Living  3     SAB  0   IAB  0   Ectopic  0   Multiple  0   Live Births  3            Home Medications     Prior to Admission medications   Medication Sig Start Date End Date Taking? Authorizing Provider  Norgestimate-Ethinyl Estradiol Triphasic 0.18/0.215/0.25 MG-25 MCG tab Take 1 tablet by mouth daily. 11/14/19   Derrel Nip, MD  Vitamin D, Ergocalciferol, (DRISDOL) 1.25 MG (50000 UT) CAPS capsule TAKE 1 CAPSULE BY MOUTH EVERY 7 DAYS 04/07/18   Howard Pouch, MD    Family History History reviewed. No pertinent family history.  Social History Social History   Tobacco Use  . Smoking status: Never Smoker  . Smokeless tobacco: Never Used  Substance Use Topics  . Alcohol use: No  . Drug use: No     Allergies   Patient has no known allergies.   Review of Systems Review of Systems  Constitutional: Negative for appetite change, chills, diaphoresis and fever.  HENT: Negative for congestion, ear pain, rhinorrhea, sinus pressure, sinus pain and sore throat.   Eyes: Negative for redness and visual disturbance.  Respiratory: Negative for cough, chest tightness, shortness of breath and wheezing.   Cardiovascular: Negative for chest pain and palpitations.  Gastrointestinal: Positive for abdominal pain and nausea. Negative for blood in stool, constipation, diarrhea and vomiting.  Genitourinary: Positive for flank pain. Negative for decreased urine volume, difficulty urinating, dysuria, frequency, genital sores, hematuria, menstrual problem, pelvic pain, urgency, vaginal bleeding, vaginal discharge and vaginal pain.  Musculoskeletal: Negative for back pain and myalgias.  Neurological: Negative for dizziness, weakness, light-headedness and headaches.  Psychiatric/Behavioral: Negative for confusion.  All other systems reviewed and are negative.    Physical Exam Triage Vital Signs ED Triage Vitals  Enc Vitals Group     BP 04/08/20 1107 (!) 92/50     Pulse Rate 04/08/20 1107 96     Resp 04/08/20 1107 20     Temp 04/08/20 1107 99 F (37.2 C)     Temp Source 04/08/20 1107 Oral     SpO2  04/08/20 1107 100 %     Weight --      Height --      Head Circumference --      Peak Flow --      Pain Score 04/08/20 1105 6     Pain Loc --      Pain Edu? --      Excl. in GC? --    No data found.  Updated Vital Signs BP (!) 94/41 (BP Location: Right Arm)   Pulse 96   Temp 99 F (37.2 C) (Oral)   Resp 20   LMP 04/01/2020 (Approximate)   SpO2 100%   Visual Acuity Right Eye Distance:   Left Eye Distance:   Bilateral Distance:    Right Eye Near:   Left Eye Near:    Bilateral Near:     Physical Exam Vitals reviewed.  Constitutional:      General: She is not in acute distress.    Appearance: Normal appearance. She is not ill-appearing.     Comments: Uncomfortable appearing  HENT:     Head: Normocephalic and atraumatic.  Cardiovascular:     Rate and Rhythm: Normal rate and regular rhythm.     Heart sounds: Normal heart sounds.  Pulmonary:     Effort: Pulmonary effort is normal.     Breath sounds: Normal breath sounds. No wheezing, rhonchi or rales.  Abdominal:     General: Bowel sounds are normal. There is no distension.     Palpations: Abdomen is soft. There is no mass.     Tenderness: There is abdominal tenderness in the right lower quadrant. There is right CVA tenderness. There is no left CVA tenderness, guarding or rebound. Positive signs include McBurney's sign. Negative signs include Murphy's sign and Rovsing's sign.     Comments: Bowel sounds positive in all 4 quadrants. Significant RLQ tenderness to palpation. And R CVAT.  Neurological:     General: No focal deficit present.     Mental Status: She is alert and oriented to person, place, and time.  Psychiatric:        Mood and Affect: Mood normal.        Behavior: Behavior normal.      UC Treatments / Results  Labs (all labs ordered are listed, but only abnormal results are displayed) Labs Reviewed  POCT URINALYSIS DIPSTICK, ED / UC - Abnormal; Notable for the following components:      Result Value    Ketones, ur 15 (*)    Hgb urine dipstick MODERATE (*)    Leukocytes,Ua SMALL (*)    All other components within normal limits  POC URINE PREG, ED    EKG   Radiology No results found.  Procedures Procedures (including critical care time)  Medications Ordered in UC Medications - No data to display  Initial Impression / Assessment and Plan / UC Course  I have reviewed the triage vital signs and  the nursing notes.  Pertinent labs & imaging results that were available during my care of the patient were reviewed by me and considered in my medical decision making (see chart for details).  Clinical Course as of 04/08/20 1219  Mon Apr 08, 2020  1115 BP(!): 92/50 [LG]    Clinical Course User Index [LG] Rhys Martini, PA-C    Pt presenting today with significant RLQ tenderness, subjective fevers/chills, and hypotension. BP typically runs 90s/50s, but BP is 94/41 today. UA with blood, leuk; urine pregnancy negative. I am concerned for appendicitis vs pyelo vs ovarian torsion. Sent to [facility] ED for further evaluation; pt was wheeled to ED by nurse.   Final Clinical Impressions(s) / UC Diagnoses   Final diagnoses:  Right lower quadrant abdominal pain     Discharge Instructions     -Head straight to [facility] ED for evaluation of right lower quadrant pain     ED Prescriptions    None     PDMP not reviewed this encounter.   Rhys Martini, PA-C 04/08/20 1221

## 2020-04-08 NOTE — ED Triage Notes (Signed)
Pt reports onset Sunday of abd pain that has gotten progressively worse. States she has bilateral flank pain now with n/v/d. Denies urinary symptoms.

## 2020-04-09 LAB — CBC
HCT: 26.6 % — ABNORMAL LOW (ref 36.0–46.0)
Hemoglobin: 8.5 g/dL — ABNORMAL LOW (ref 12.0–15.0)
MCH: 29.7 pg (ref 26.0–34.0)
MCHC: 32 g/dL (ref 30.0–36.0)
MCV: 93 fL (ref 80.0–100.0)
Platelets: 188 10*3/uL (ref 150–400)
RBC: 2.86 MIL/uL — ABNORMAL LOW (ref 3.87–5.11)
RDW: 12.1 % (ref 11.5–15.5)
WBC: 16.4 10*3/uL — ABNORMAL HIGH (ref 4.0–10.5)
nRBC: 0 % (ref 0.0–0.2)

## 2020-04-09 LAB — BASIC METABOLIC PANEL
Anion gap: 8 (ref 5–15)
BUN: 7 mg/dL (ref 6–20)
CO2: 20 mmol/L — ABNORMAL LOW (ref 22–32)
Calcium: 7.2 mg/dL — ABNORMAL LOW (ref 8.9–10.3)
Chloride: 107 mmol/L (ref 98–111)
Creatinine, Ser: 0.6 mg/dL (ref 0.44–1.00)
GFR, Estimated: >60 mL/min (ref >60)
Glucose, Bld: 115 mg/dL — ABNORMAL HIGH (ref 70–99)
Potassium: 3.7 mmol/L (ref 3.5–5.1)
Sodium: 135 mmol/L (ref 135–145)

## 2020-04-09 LAB — HCG, SERUM, QUALITATIVE: Preg, Serum: NEGATIVE

## 2020-04-09 LAB — PROTIME-INR
INR: 1.4 — ABNORMAL HIGH (ref 0.8–1.2)
Prothrombin Time: 16.2 seconds — ABNORMAL HIGH (ref 11.4–15.2)

## 2020-04-09 LAB — HIV ANTIBODY (ROUTINE TESTING W REFLEX): HIV Screen 4th Generation wRfx: NONREACTIVE

## 2020-04-09 MED ORDER — PANTOPRAZOLE SODIUM 40 MG IV SOLR
40.0000 mg | Freq: Every day | INTRAVENOUS | Status: DC
  Administered 2020-04-09 – 2020-04-15 (×8): 40 mg via INTRAVENOUS
  Filled 2020-04-09 (×8): qty 40

## 2020-04-09 MED ORDER — ACETAMINOPHEN 325 MG PO TABS
650.0000 mg | ORAL_TABLET | Freq: Four times a day (QID) | ORAL | Status: DC | PRN
  Administered 2020-04-09 – 2020-04-14 (×5): 650 mg via ORAL
  Filled 2020-04-09 (×4): qty 2

## 2020-04-09 MED ORDER — POTASSIUM CHLORIDE IN NACL 20-0.9 MEQ/L-% IV SOLN
INTRAVENOUS | Status: DC
  Filled 2020-04-09 (×16): qty 1000

## 2020-04-09 MED ORDER — DIPHENHYDRAMINE HCL 25 MG PO CAPS: 25.0000 mg | ORAL_CAPSULE | Freq: Four times a day (QID) | ORAL | Status: DC | PRN

## 2020-04-09 MED ORDER — HYDROMORPHONE HCL 1 MG/ML IJ SOLN
0.5000 mg | INTRAMUSCULAR | Status: DC | PRN
  Administered 2020-04-09 – 2020-04-14 (×2): 0.5 mg via INTRAVENOUS
  Filled 2020-04-09: qty 1

## 2020-04-09 MED ORDER — ONDANSETRON 4 MG PO TBDP: 4.0000 mg | ORAL_TABLET | Freq: Four times a day (QID) | ORAL | Status: DC | PRN

## 2020-04-09 MED ORDER — DIPHENHYDRAMINE HCL 50 MG/ML IJ SOLN: 25.0000 mg | Freq: Four times a day (QID) | INTRAMUSCULAR | Status: DC | PRN

## 2020-04-09 MED ORDER — LACTATED RINGERS IV BOLUS
1000.0000 mL | Freq: Once | INTRAVENOUS | Status: AC
  Administered 2020-04-09: 1000 mL via INTRAVENOUS

## 2020-04-09 MED ORDER — ONDANSETRON HCL 4 MG/2ML IJ SOLN
4.0000 mg | Freq: Four times a day (QID) | INTRAMUSCULAR | Status: DC | PRN
  Administered 2020-04-09 – 2020-04-14 (×2): 4 mg via INTRAVENOUS
  Filled 2020-04-09 (×2): qty 2

## 2020-04-09 MED ORDER — OXYCODONE HCL 5 MG PO TABS
5.0000 mg | ORAL_TABLET | ORAL | Status: DC | PRN
  Administered 2020-04-09 – 2020-04-13 (×5): 10 mg via ORAL
  Filled 2020-04-09 (×7): qty 2

## 2020-04-09 MED ORDER — ACETAMINOPHEN 650 MG RE SUPP: 650.0000 mg | Freq: Four times a day (QID) | RECTAL | Status: DC | PRN

## 2020-04-09 MED ORDER — SODIUM CHLORIDE 0.9 % IV BOLUS
1000.0000 mL | Freq: Once | INTRAVENOUS | Status: AC
  Administered 2020-04-09: 1000 mL via INTRAVENOUS

## 2020-04-09 NOTE — ED Notes (Signed)
Third attempt at giving report. ED Day RN Maryelizabeth Kaufmann will give report to 6N Day RN.

## 2020-04-09 NOTE — ED Notes (Signed)
Tried calling report again but 6N not answering at this time.

## 2020-04-09 NOTE — ED Notes (Signed)
Able to speak with Dr. Laurell Josephs about pts BP, will bolus pt with 1LR.

## 2020-04-09 NOTE — ED Notes (Signed)
Trying to get in touch with Surgery regarding pts current blood pressure of 85/53, will await for callback / orders.

## 2020-04-09 NOTE — Progress Notes (Signed)
  Request seen for image guided drain placement for RLQ abscess.  Images reviewed by Dr. Ruel Favors.  The process in the RLQ is not clearly defined.  Recommend IV antibiotics and repeat CT in 48 hours with oral contrast.  Daphane Odekirk S Johnda Billiot PA-C 04/09/2020 8:33 AM

## 2020-04-09 NOTE — ED Notes (Signed)
Unhooked pt to go to restroom. Pt able to ambulate without assistance to and from restroom. Pt back in bed resting with call light in reach.

## 2020-04-09 NOTE — Progress Notes (Signed)
Progress Note     Subjective: Patient reports pain in RLQ. Denies nausea or vomiting, passing flatus. Reports she just had LMP so unlikely that she could be pregnant.   Objective: Vital signs in last 24 hours: Temp:  [98.9 F (37.2 C)-101.6 F (38.7 C)] 98.9 F (37.2 C) (01/25 0603) Pulse Rate:  [88-103] 88 (01/25 0603) Resp:  [16-20] 16 (01/25 0603) BP: (85-101)/(41-66) 85/53 (01/25 0603) SpO2:  [96 %-100 %] 97 % (01/25 0603) Weight:  [48.1 kg-72.6 kg] 48.1 kg (01/25 0229)    Intake/Output from previous day: No intake/output data recorded. Intake/Output this shift: No intake/output data recorded.  PE: General: pleasant, WD, WN female who is laying in bed in NAD Heart: regular, rate, and rhythm.  Normal s1,s2. No obvious murmurs, gallops, or rubs noted.   Lungs: CTAB, no wheezes, rhonchi, or rales noted.  Respiratory effort nonlabored Abd: soft, ttp in RLQ, ND, +BS, no masses, hernias, or organomegaly MS: all 4 extremities are symmetrical with no cyanosis, clubbing, or edema. Skin: warm and dry with no masses, lesions, or rashes Neuro: Cranial nerves 2-12 grossly intact, sensation is normal throughout Psych: A&Ox3 with an appropriate affect.    Lab Results:  Recent Labs    04/08/20 1403 04/09/20 0350  WBC 18.4* 16.4*  HGB 11.0* 8.5*  HCT 34.9* 26.6*  PLT 232 188   BMET Recent Labs    04/08/20 1403 04/09/20 0350  NA 135 135  K 3.9 3.7  CL 100 107  CO2 21* 20*  GLUCOSE 98 115*  BUN 9 7  CREATININE 0.63 0.60  CALCIUM 9.0 7.2*   PT/INR Recent Labs    04/09/20 0350  LABPROT 16.2*  INR 1.4*   CMP     Component Value Date/Time   NA 135 04/09/2020 0350   NA 139 04/05/2018 0953   K 3.7 04/09/2020 0350   CL 107 04/09/2020 0350   CO2 20 (L) 04/09/2020 0350   GLUCOSE 115 (H) 04/09/2020 0350   BUN 7 04/09/2020 0350   BUN 9 04/05/2018 0953   CREATININE 0.60 04/09/2020 0350   CALCIUM 7.2 (L) 04/09/2020 0350   PROT 8.3 (H) 04/08/2020 1403   ALBUMIN  3.4 (L) 04/08/2020 1403   AST 13 (L) 04/08/2020 1403   ALT 17 04/08/2020 1403   ALKPHOS 65 04/08/2020 1403   BILITOT 0.6 04/08/2020 1403   GFRNONAA >60 04/09/2020 0350   GFRAA 139 04/05/2018 0953   Lipase     Component Value Date/Time   LIPASE 29 04/08/2020 1403       Studies/Results: CT ABDOMEN PELVIS W CONTRAST  Result Date: 04/08/2020 CLINICAL DATA:  Abdominal pain EXAM: CT ABDOMEN AND PELVIS WITH CONTRAST TECHNIQUE: Multidetector CT imaging of the abdomen and pelvis was performed using the standard protocol following bolus administration of intravenous contrast. CONTRAST:  OMNIPAQUE IOHEXOL 300 MG/ML  SOLN COMPARISON:  None. FINDINGS: Lower chest: Lung bases are clear. No effusions. Heart is normal size. Hepatobiliary: No focal hepatic abnormality. Gallbladder unremarkable. Pancreas: No focal abnormality or ductal dilatation. Spleen: No focal abnormality.  Normal size. Adrenals/Urinary Tract: No adrenal abnormality. No focal renal abnormality. No stones or hydronephrosis. Urinary bladder is unremarkable. Stomach/Bowel: Complex process seen in the right lower quadrant. There appears to be extraluminal complex fluid collection adjacent to the cecum, but it is difficult to determine what is bowel and what is extraluminal fluid collection without oral contrast. Appearance is suspicious for possible ruptured appendicitis. No evidence of bowel obstruction. Vascular/Lymphatic: No evidence  of aneurysm or adenopathy. Reproductive: Uterus and adnexa unremarkable.  No mass. Other: Small amount of free fluid in the pelvis.  No free air. Musculoskeletal: No acute bony abnormality. IMPRESSION: Complex process in the right lower quadrant adjacent to the cecum concerning for extraluminal fluid collection/abscess, possibly related to ruptured appendicitis, but difficult to separate bowel from fluid collection without oral contrast. These results were called by telephone at the time of interpretation on  04/08/2020 at 9:20 pm to provider JULIE HAVILAND , who verbally acknowledged these results. Electronically Signed   By: Charlett Nose M.D.   On: 04/08/2020 21:23    Anti-infectives: Anti-infectives (From admission, onward)   Start     Dose/Rate Route Frequency Ordered Stop   04/09/20 0600  piperacillin-tazobactam (ZOSYN) IVPB 3.375 g        3.375 g 12.5 mL/hr over 240 Minutes Intravenous Every 8 hours 04/08/20 2226     04/08/20 2230  piperacillin-tazobactam (ZOSYN) IVPB 3.375 g        3.375 g 100 mL/hr over 30 Minutes Intravenous  Once 04/08/20 2216 04/08/20 2324       Assessment/Plan Mild elevation of beta Hcg on istat - check serum qualitative today   Ruptured appendicitis with abscess - CT 1/24 with complex process in RLQ - IR consulted for percutaneous drainage - WBC 16.4 from 18.4, Tmax 101.6 - continue bowel rest and IV abx - no indication for emergent surgical intervention at this time, hopefully patient improves with antibiotics and drainage of collection. Will then plan for eventual interval appendectomy as an outpatient   FEN: NPO, IVF VTE: SCDs ID: [email or web address] 1/24>>  LOS: 1 day    Juliet Rude , [facility] 04/09/2020, 8:21 AM Please see Amion for pager number during day hours 7:00am-4:30pm

## 2020-04-09 NOTE — ED Notes (Signed)
Attempted to give reportx1 

## 2020-04-10 LAB — BASIC METABOLIC PANEL
Anion gap: 11 (ref 5–15)
BUN: <5 mg/dL — ABNORMAL LOW (ref 6–20)
CO2: 18 mmol/L — ABNORMAL LOW (ref 22–32)
Calcium: 8.4 mg/dL — ABNORMAL LOW (ref 8.9–10.3)
Chloride: 104 mmol/L (ref 98–111)
Creatinine, Ser: 0.77 mg/dL (ref 0.44–1.00)
GFR, Estimated: >60 mL/min (ref >60)
Glucose, Bld: 74 mg/dL (ref 70–99)
Potassium: 4.3 mmol/L (ref 3.5–5.1)
Sodium: 133 mmol/L — ABNORMAL LOW (ref 135–145)

## 2020-04-10 LAB — CBC
HCT: 29.6 % — ABNORMAL LOW (ref 36.0–46.0)
Hemoglobin: 9.5 g/dL — ABNORMAL LOW (ref 12.0–15.0)
MCH: 29.4 pg (ref 26.0–34.0)
MCHC: 32.1 g/dL (ref 30.0–36.0)
MCV: 91.6 fL (ref 80.0–100.0)
Platelets: 209 10*3/uL (ref 150–400)
RBC: 3.23 MIL/uL — ABNORMAL LOW (ref 3.87–5.11)
RDW: 12 % (ref 11.5–15.5)
WBC: 17.5 10*3/uL — ABNORMAL HIGH (ref 4.0–10.5)
nRBC: 0 % (ref 0.0–0.2)

## 2020-04-10 LAB — MAGNESIUM: Magnesium: 1.9 mg/dL (ref 1.7–2.4)

## 2020-04-10 MED ORDER — ENOXAPARIN SODIUM 40 MG/0.4ML ~~LOC~~ SOLN
40.0000 mg | SUBCUTANEOUS | Status: DC
  Administered 2020-04-10: 40 mg via SUBCUTANEOUS
  Filled 2020-04-10: qty 0.4

## 2020-04-10 MED ORDER — METHOCARBAMOL 1000 MG/10ML IJ SOLN
500.0000 mg | Freq: Four times a day (QID) | INTRAVENOUS | Status: DC | PRN
  Administered 2020-04-10: 500 mg via INTRAVENOUS
  Filled 2020-04-10 (×2): qty 5

## 2020-04-10 NOTE — Progress Notes (Signed)
[facility] Progress Note     Subjective: CC-  Complains of focal RLQ tenderness today. Passing flatus, no BM. Denies n/v. Pain is still intermittently sharp at times, but this is less frequent or severe compared to yesterday. Rates pain as 5/10 today vs 8-9/10 yesterday. WBC 17.5 from 16.4, TMAX 100. BP is still soft but HR WNL. Patient denies lightheadedness/ dizziness with ambulation. States that her pressure tends to run low.  Objective: Vital signs in last 24 hours: Temp:  [98.2 F (36.8 C)-100 F (37.8 C)] 98.3 F (36.8 C) (01/26 0453) Pulse Rate:  [79-89] 79 (01/26 0453) Resp:  [16-20] 18 (01/26 0453) BP: (89-101)/(56-65) 89/58 (01/26 0453) SpO2:  [97 %-100 %] 100 % (01/26 0453) Last BM Date: 04/07/20  Intake/Output from previous day: 01/25 0701 - 01/26 0700 In: 3162.9 [I.V.:2999.4; IV Piggyback:163.5] Out: -  Intake/Output this shift: No intake/output data recorded.  PE: General: Alert, NAD Heart: RRR Lungs: CTAB, no wheezes, rhonchi, or rales noted.  Respiratory effort nonlabored Abd: soft, ND, TTP in RLQ with some voluntary guarding, no diffuse tenderness, +BS, no masses, hernias, or organomegaly MS: all 4 extremities are symmetrical with no cyanosis, clubbing, or edema. Skin: warm and dry with no masses, lesions, or rashes Psych: A&Ox3 with an appropriate affect.  Lab Results:  Recent Labs    04/09/20 0350 04/10/20 0743  WBC 16.4* 17.5*  HGB 8.5* 9.5*  HCT 26.6* 29.6*  PLT 188 209   BMET Recent Labs    04/08/20 1403 04/09/20 0350  NA 135 135  K 3.9 3.7  CL 100 107  CO2 21* 20*  GLUCOSE 98 115*  BUN 9 7  CREATININE 0.63 0.60  CALCIUM 9.0 7.2*   PT/INR Recent Labs    04/09/20 0350  LABPROT 16.2*  INR 1.4*   CMP     Component Value Date/Time   NA 135 04/09/2020 0350   NA 139 04/05/2018 0953   K 3.7 04/09/2020 0350   CL 107 04/09/2020 0350   CO2 20 (L) 04/09/2020 0350   GLUCOSE 115 (H) 04/09/2020 0350   BUN 7 04/09/2020  0350   BUN 9 04/05/2018 0953   CREATININE 0.60 04/09/2020 0350   CALCIUM 7.2 (L) 04/09/2020 0350   PROT 8.3 (H) 04/08/2020 1403   ALBUMIN 3.4 (L) 04/08/2020 1403   AST 13 (L) 04/08/2020 1403   ALT 17 04/08/2020 1403   ALKPHOS 65 04/08/2020 1403   BILITOT 0.6 04/08/2020 1403   GFRNONAA >60 04/09/2020 0350   GFRAA 139 04/05/2018 0953   Lipase     Component Value Date/Time   LIPASE 29 04/08/2020 1403       Studies/Results: CT ABDOMEN PELVIS W CONTRAST  Result Date: 04/08/2020 CLINICAL DATA:  Abdominal pain EXAM: CT ABDOMEN AND PELVIS WITH CONTRAST TECHNIQUE: Multidetector CT imaging of the abdomen and pelvis was performed using the standard protocol following bolus administration of intravenous contrast. CONTRAST:  OMNIPAQUE IOHEXOL 300 MG/ML  SOLN COMPARISON:  None. FINDINGS: Lower chest: Lung bases are clear. No effusions. Heart is normal size. Hepatobiliary: No focal hepatic abnormality. Gallbladder unremarkable. Pancreas: No focal abnormality or ductal dilatation. Spleen: No focal abnormality.  Normal size. Adrenals/Urinary Tract: No adrenal abnormality. No focal renal abnormality. No stones or hydronephrosis. Urinary bladder is unremarkable. Stomach/Bowel: Complex process seen in the right lower quadrant. There appears to be extraluminal complex fluid collection adjacent to the cecum, but it is difficult to determine what is bowel and what is extraluminal fluid collection without  oral contrast. Appearance is suspicious for possible ruptured appendicitis. No evidence of bowel obstruction. Vascular/Lymphatic: No evidence of aneurysm or adenopathy. Reproductive: Uterus and adnexa unremarkable.  No mass. Other: Small amount of free fluid in the pelvis.  No free air. Musculoskeletal: No acute bony abnormality. IMPRESSION: Complex process in the right lower quadrant adjacent to the cecum concerning for extraluminal fluid collection/abscess, possibly related to ruptured appendicitis, but  difficult to separate bowel from fluid collection without oral contrast. These results were called by telephone at the time of interpretation on 04/08/2020 at 9:20 pm to provider JULIE HAVILAND , who verbally acknowledged these results. Electronically Signed   By: Charlett Nose M.D.   On: 04/08/2020 21:23    Anti-infectives: Anti-infectives (From admission, onward)   Start     Dose/Rate Route Frequency Ordered Stop   04/09/20 0600  piperacillin-tazobactam (ZOSYN) IVPB 3.375 g        3.375 g 12.5 mL/hr over 240 Minutes Intravenous Every 8 hours 04/08/20 2226     04/08/20 2230  piperacillin-tazobactam (ZOSYN) IVPB 3.375 g        3.375 g 100 mL/hr over 30 Minutes Intravenous  Once 04/08/20 2216 04/08/20 2324       Assessment/Plan Mild elevation of beta Hcg on istat - qualitative pregnancy test negative Anemia - Hgb 9.5 from 8.5, stable  Ruptured appendicitis with abscess - CT 1/24 with complex process in RLQ. IR consulted and states this is not amenable to perc drainage at this time, recommend repeat CT in 48 hours - WBC 17.5 from 16.4, TMAX 100 - No indication for emergent surgical intervention at this time, hopefully patient improves with antibiotics. Continue IV zosyn. Plan to repeat CT scan in 1-2 days to see if abscess becomes amenable to drainage. - Will then plan for eventual interval appendectomy as an outpatient   FEN: sips of clears, IVF@125cc /hr VTE: SCDs, lovenox ID: [email or web address] 1/24>>   LOS: 2 days    Franne Forts, [facility] 04/10/2020, 8:55 AM Please see Amion for pager number during day hours 7:00am-4:30pm

## 2020-04-11 ENCOUNTER — Inpatient Hospital Stay (HOSPITAL_COMMUNITY): Payer: BC Managed Care – PPO

## 2020-04-11 LAB — MAGNESIUM: Magnesium: 2.1 mg/dL (ref 1.7–2.4)

## 2020-04-11 LAB — BASIC METABOLIC PANEL
Anion gap: 12 (ref 5–15)
BUN: <5 mg/dL — ABNORMAL LOW (ref 6–20)
CO2: 19 mmol/L — ABNORMAL LOW (ref 22–32)
Calcium: 8.6 mg/dL — ABNORMAL LOW (ref 8.9–10.3)
Chloride: 102 mmol/L (ref 98–111)
Creatinine, Ser: 0.74 mg/dL (ref 0.44–1.00)
GFR, Estimated: >60 mL/min (ref >60)
Glucose, Bld: 90 mg/dL (ref 70–99)
Potassium: 4.1 mmol/L (ref 3.5–5.1)
Sodium: 133 mmol/L — ABNORMAL LOW (ref 135–145)

## 2020-04-11 LAB — CBC
HCT: 31.6 % — ABNORMAL LOW (ref 36.0–46.0)
Hemoglobin: 10.2 g/dL — ABNORMAL LOW (ref 12.0–15.0)
MCH: 29.4 pg (ref 26.0–34.0)
MCHC: 32.3 g/dL (ref 30.0–36.0)
MCV: 91.1 fL (ref 80.0–100.0)
Platelets: 256 10*3/uL (ref 150–400)
RBC: 3.47 MIL/uL — ABNORMAL LOW (ref 3.87–5.11)
RDW: 12.2 % (ref 11.5–15.5)
WBC: 18.8 10*3/uL — ABNORMAL HIGH (ref 4.0–10.5)
nRBC: 0 % (ref 0.0–0.2)

## 2020-04-11 MED ORDER — ENOXAPARIN SODIUM 40 MG/0.4ML ~~LOC~~ SOLN
40.0000 mg | SUBCUTANEOUS | Status: DC
  Filled 2020-04-11 (×3): qty 0.4

## 2020-04-11 MED ORDER — IOHEXOL 300 MG/ML  SOLN
100.0000 mL | Freq: Once | INTRAMUSCULAR | Status: AC | PRN
  Administered 2020-04-11: 100 mL via INTRAVENOUS

## 2020-04-11 MED ORDER — IOHEXOL 9 MG/ML PO SOLN
ORAL | Status: AC
  Administered 2020-04-11: 500 mL
  Filled 2020-04-11: qty 1000

## 2020-04-11 NOTE — Progress Notes (Signed)
[facility] Progress Note     Subjective: CC-  Continues to feel better each day. States that she has some persistent RLQ abdominal pain but it is less than yesterday. Denies n/v. Tolerated sips of clear liquids. Passing flatus, no BM. States that she did have some sweats/chills over night. WBC 18.8, afebrile.  Objective: Vital signs in last 24 hours: Temp:  [98.1 F (36.7 C)-98.6 F (37 C)] 98.5 F (36.9 C) (01/27 0749) Pulse Rate:  [81-100] 100 (01/27 0749) Resp:  [17-18] 18 (01/27 0749) BP: (90-114)/(58-63) 90/62 (01/27 0749) SpO2:  [93 %-100 %] 93 % (01/27 0749) Last BM Date: 04/07/20  Intake/Output from previous day: 01/26 0701 - 01/27 0700 In: 1252.4 [I.V.:1173.8; IV Piggyback:78.6] Out: 800 [Urine:800] Intake/Output this shift: No intake/output data recorded.  PE: General: Alert, NAD Heart: RRR Lungs: CTAB, no wheezes, rhonchi, or rales noted. Respiratory effort nonlabored Abd: soft,ND, TTPin RLQ with some voluntary guarding, no diffuse tenderness, +BS, no masses, hernias, or organomegaly MS: all 4 extremities are symmetrical with no cyanosis, clubbing, or edema. Skin: warm and dry with no masses, lesions, or rashes Psych: A&Ox3 with an appropriate affect.  Lab Results:  Recent Labs    04/10/20 0743 04/11/20 0239  WBC 17.5* 18.8*  HGB 9.5* 10.2*  HCT 29.6* 31.6*  PLT 209 256   BMET Recent Labs    04/10/20 0743 04/11/20 0239  NA 133* 133*  K 4.3 4.1  CL 104 102  CO2 18* 19*  GLUCOSE 74 90  BUN <5* <5*  CREATININE 0.77 0.74  CALCIUM 8.4* 8.6*   PT/INR Recent Labs    04/09/20 0350  LABPROT 16.2*  INR 1.4*   CMP     Component Value Date/Time   NA 133 (L) 04/11/2020 0239   NA 139 04/05/2018 0953   K 4.1 04/11/2020 0239   CL 102 04/11/2020 0239   CO2 19 (L) 04/11/2020 0239   GLUCOSE 90 04/11/2020 0239   BUN <5 (L) 04/11/2020 0239   BUN 9 04/05/2018 0953   CREATININE 0.74 04/11/2020 0239   CALCIUM 8.6 (L) 04/11/2020 0239    PROT 8.3 (H) 04/08/2020 1403   ALBUMIN 3.4 (L) 04/08/2020 1403   AST 13 (L) 04/08/2020 1403   ALT 17 04/08/2020 1403   ALKPHOS 65 04/08/2020 1403   BILITOT 0.6 04/08/2020 1403   GFRNONAA >60 04/11/2020 0239   GFRAA 139 04/05/2018 0953   Lipase     Component Value Date/Time   LIPASE 29 04/08/2020 1403       Studies/Results: No results found.  Anti-infectives: Anti-infectives (From admission, onward)   Start     Dose/Rate Route Frequency Ordered Stop   04/09/20 0600  piperacillin-tazobactam (ZOSYN) IVPB 3.375 g        3.375 g 12.5 mL/hr over 240 Minutes Intravenous Every 8 hours 04/08/20 2226     04/08/20 2230  piperacillin-tazobactam (ZOSYN) IVPB 3.375 g        3.375 g 100 mL/hr over 30 Minutes Intravenous  Once 04/08/20 2216 04/08/20 2324       Assessment/Plan Mild elevation of beta Hcg on istat - qualitative pregnancy test negative Anemia - Hgb 10.2 from 9.5, stable  Ruptured appendicitis with abscess - CT 1/24 with complex process in RLQ. IR consulted and states this is not amenable to perc drainage at this time, recommend repeat CT in 48 hours - WBC 18.8 from 17.5, afebrile - Repeat CT scan today to see if process in RLQ has developed into abscess  drainable by IR. Continue IV zosyn. NPO for now. - Will then plan for eventual interval appendectomy as an outpatient   FEN: NPO, IVF@125cc /hr VTE: SCDs, lovenox ID: [email or web address] 1/24>>   LOS: 3 days    Franne Forts, [facility] 04/11/2020, 8:06 AM Please see Amion for pager number during day hours 7:00am-4:30pm

## 2020-04-11 NOTE — Consult Note (Signed)
Chief Complaint: Patient was seen in consultation today for CT-guided aspiration/possible drainage of right lower abdominal fluid collection Chief Complaint  Patient presents with  . Abdominal Pain  . Emesis     Referring Physician(s): Connor,C  Supervising Physician: Gilmer Mor  Patient Status: [facility] - In-pt  History of Present Illness: Natalie [name] is a 35 y.o. female who was admitted to [facility] on 04/08/20 with persistent right lower quadrant abdominal pain with intermittent fever, malaise.  Subsequent imaging has revealed acute inflammatory process pericecal region with cecal wall thickening, pericecal inflammation, enlarged enhancing appendix and reactive edema of the distal ileum likely reflecting ruptured retrocecal appendicitis with associated multiloculated periappendiceal abscess.  Request now received from surgical team for image guided aspiration/possible drainage of the abdominal abscess. She  is currently afebrile, BP 92/56, heart rate normal, HR 89, WBC 18.8 hemoglobin 10.2, platelets 256k, creatinine normal, COVID-19 negative.   Past Medical History:  Diagnosis Date  . Normal pregnancy, repeat 07/01/2011  . S/P laparoscopy 02/20/2015  . S/P laparoscopy 02/20/2015  . SVD (spontaneous vaginal delivery) 07/02/2011  . SVD (spontaneous vaginal delivery) 09/10/2016    Past Surgical History:  Procedure Laterality Date  . HYSTEROSCOPY N/A 01/07/2015   Procedure: HYSTEROSCOPY DIAGNOSTIC, ATTEMPTED REMOVAL OF iNTRAUTERINE DEVICE;  Surgeon: Sherian Rein, MD;  Location: [facility];  Service: Gynecology;  Laterality: N/A;  MD requests 1hr OR time  . IUD REMOVAL N/A 02/20/2015   Procedure: INTRAUTERINE DEVICE (IUD) REMOVAL;  Surgeon: Sherian Rein, MD;  Location: [facility];  Service: Gynecology;  Laterality: N/A;  . LAPAROSCOPY N/A 02/20/2015   Procedure: LAPAROSCOPY OPERATIVE;  Surgeon: Sherian Rein, MD;  Location: [facility];  Service: Gynecology;   Laterality: N/A;  . NO PAST SURGERIES      Allergies: Patient has no known allergies.  Medications: Prior to Admission medications   Not on File     History reviewed. No pertinent family history.  Social History   Socioeconomic History  . Marital status: Married    Spouse name: Not on file  . Number of children: Not on file  . Years of education: Not on file  . Highest education level: Not on file  Occupational History  . Not on file  Tobacco Use  . Smoking status: Never Smoker  . Smokeless tobacco: Never Used  Substance and Sexual Activity  . Alcohol use: No  . Drug use: No  . Sexual activity: Yes  Other Topics Concern  . Not on file  Social History Narrative  . Not on file   Social Determinants of Health   Financial Resource Strain: Not on file  Food Insecurity: Not on file  Transportation Needs: Not on file  Physical Activity: Not on file  Stress: Not on file  Social Connections: Not on file      Review of Systems currently denies fever, headache, chest pain, dyspnea, cough, back pain, vomiting or bleeding; continues to have mild to moderate right lower quadrant discomfort and constipation  Vital Signs: BP (!) 92/56 (BP Location: Left Arm)   Pulse 89   Temp 98.8 F (37.1 C) (Oral)   Resp 18   Ht 5\' 2"  (1.575 m)   Wt 106 lb (48.1 kg)   LMP 04/01/2020 (Approximate)   SpO2 100%   BMI 19.39 kg/m   Physical Exam awake, alert.  Chest clear to auscultation bilaterally.  Heart with regular rate and rhythm.  Abdomen soft, positive bowel sounds, mild- mod tender right lower quadrant to palpation.  No lower extremity edema.  Imaging: CT ABDOMEN PELVIS W CONTRAST  Result Date: 04/11/2020 CLINICAL DATA:  RIGHT lower quadrant tenderness and abdominal abscess, follow-up EXAM: CT ABDOMEN AND PELVIS WITH CONTRAST TECHNIQUE: Multidetector CT imaging of the abdomen and pelvis was performed using the standard protocol following bolus administration of intravenous  contrast. Sagittal and coronal MPR images reconstructed from axial data set. CONTRAST:  OMNIPAQUE IOHEXOL 300 MG/ML SOLN IV. Dilute oral contrast. COMPARISON:  04/08/2020 FINDINGS: Lower chest: Lung bases clear Hepatobiliary: Distended gallbladder without calcification or definite wall thickening. Liver unremarkable. No biliary dilatation. Pancreas: Normal appearance Spleen: Upper normal spleen size without focal abnormality Adrenals/Urinary Tract: Adrenal glands, kidneys, ureters, and bladder normal appearance Stomach/Bowel: Stomach decompressed. Again identified inflammatory process in the pericecal region with associated cecal wall thickening, appendiceal enlargement, and enhancing appendiceal walls consistent with retrocecal appendicitis. Multiloculated fluid collection identified consistent with periappendiceal abscess. Area of inflammatory changes measures overall 5.6 x 5.4 x 9.2 cm in size. Few adjacent reactive lymph nodes. Associated mild bowel wall thickening of distal ileum. No evidence of bowel obstruction. Remaining bowel loops normal appearance. Vascular/Lymphatic: Vascular structures patent. Aorta normal caliber. Reproductive: Uterus and ovaries unremarkable Other: Small amount of free fluid in pelvis. No free air. No hernia. Musculoskeletal: Unremarkable IMPRESSION: Acute inflammatory process pericecal with cecal wall thickening, pericecal inflammation, enlarged enhancing appendix, and reactive edema of the distal ileum likely reflecting ruptured retrocecal appendicitis. Multiloculated periappendiceal abscess 5.6 x 5.4 x 9.2 cm in size, slightly increased from previous exam. Small amount of free fluid in pelvis. No other intra-abdominal or intrapelvic abnormalities. Electronically Signed   By: Ulyses Southward M.D.   On: 04/11/2020 13:30   CT ABDOMEN PELVIS W CONTRAST  Result Date: 04/08/2020 CLINICAL DATA:  Abdominal pain EXAM: CT ABDOMEN AND PELVIS WITH CONTRAST TECHNIQUE: Multidetector CT  imaging of the abdomen and pelvis was performed using the standard protocol following bolus administration of intravenous contrast. CONTRAST:  OMNIPAQUE IOHEXOL 300 MG/ML  SOLN COMPARISON:  None. FINDINGS: Lower chest: Lung bases are clear. No effusions. Heart is normal size. Hepatobiliary: No focal hepatic abnormality. Gallbladder unremarkable. Pancreas: No focal abnormality or ductal dilatation. Spleen: No focal abnormality.  Normal size. Adrenals/Urinary Tract: No adrenal abnormality. No focal renal abnormality. No stones or hydronephrosis. Urinary bladder is unremarkable. Stomach/Bowel: Complex process seen in the right lower quadrant. There appears to be extraluminal complex fluid collection adjacent to the cecum, but it is difficult to determine what is bowel and what is extraluminal fluid collection without oral contrast. Appearance is suspicious for possible ruptured appendicitis. No evidence of bowel obstruction. Vascular/Lymphatic: No evidence of aneurysm or adenopathy. Reproductive: Uterus and adnexa unremarkable.  No mass. Other: Small amount of free fluid in the pelvis.  No free air. Musculoskeletal: No acute bony abnormality. IMPRESSION: Complex process in the right lower quadrant adjacent to the cecum concerning for extraluminal fluid collection/abscess, possibly related to ruptured appendicitis, but difficult to separate bowel from fluid collection without oral contrast. These results were called by telephone at the time of interpretation on 04/08/2020 at 9:20 pm to provider JULIE HAVILAND , who verbally acknowledged these results. Electronically Signed   By: Charlett Nose M.D.   On: 04/08/2020 21:23    Labs:  CBC: Recent Labs    04/08/20 1403 04/09/20 0350 04/10/20 0743 04/11/20 0239  WBC 18.4* 16.4* 17.5* 18.8*  HGB 11.0* 8.5* 9.5* 10.2*  HCT 34.9* 26.6* 29.6* 31.6*  PLT 232 188 209 256    COAGS:  Recent Labs    04/09/20 0350  INR 1.4*    BMP: Recent Labs     04/08/20 1403 04/09/20 0350 04/10/20 0743 04/11/20 0239  NA 135 135 133* 133*  K 3.9 3.7 4.3 4.1  CL 100 107 104 102  CO2 21* 20* 18* 19*  GLUCOSE 98 115* 74 90  BUN 9 7 <5* <5*  CALCIUM 9.0 7.2* 8.4* 8.6*  CREATININE 0.63 0.60 0.77 0.74  GFRNONAA >60 >60 >60 >60    LIVER FUNCTION TESTS: Recent Labs    04/08/20 1403  BILITOT 0.6  AST 13*  ALT 17  ALKPHOS 65  PROT 8.3*  ALBUMIN 3.4*    TUMOR MARKERS: No results for input(s): AFPTM, CEA, CA199, CHROMGRNA in the last 8760 hours.  Assessment and Plan: 35 y.o. female who was admitted to [facility] on 04/08/20 with persistent right lower quadrant abdominal pain with intermittent fever, malaise.  Subsequent imaging has revealed acute inflammatory process pericecal region with cecal wall thickening, pericecal inflammation, enlarged enhancing appendix and reactive edema of the distal ileum likely reflecting ruptured retrocecal appendicitis with associated multiloculated periappendiceal abscess.  Request now received from surgical team for image guided aspiration/possible drainage of the abdominal abscess. She  is currently afebrile, BP 92/56, heart rate normal, HR 89, WBC 18.8 hemoglobin 10.2, platelets 256k, creatinine normal, COVID-19 negative.  Imaging studies were reviewed by Dr. Fredia Sorrow. Risks and benefits discussed with the patient/spouse including bleeding, infection, damage to adjacent structures, bowel perforation/fistula connection, inability to drain abscess and sepsis.  All of the patient's questions were answered, patient is agreeable to proceed. Consent signed and in chart.  Procedure scheduled for 1/28   Thank you for this interesting consult.  I greatly enjoyed meeting Garnetta Frutiger and look forward to participating in their care.  A copy of this report was sent to the requesting provider on this date.  Electronically Signed: D. Jeananne Rama, PA-C 04/11/2020, 2:39 PM   I spent a total of  30 minutes    in face to face in clinical consultation, greater than 50% of which was counseling/coordinating care for CT-guided aspiration/possible drainage of right lower abdominal fluid collection/abscess

## 2020-04-11 NOTE — Progress Notes (Signed)
Pt did not ask for pain medication today, it was at a tolerable level for her.  She had a CT today and will have drain placement tomorrow (01/27). She is on clears until midnight tonight.  Pt had a great day, husband bedside. Husband is working on getting with [employer] to complete paperwork for FMLA.

## 2020-04-12 ENCOUNTER — Encounter (HOSPITAL_COMMUNITY): Payer: Self-pay | Admitting: General Surgery

## 2020-04-12 ENCOUNTER — Inpatient Hospital Stay (HOSPITAL_COMMUNITY): Payer: BC Managed Care – PPO

## 2020-04-12 LAB — BASIC METABOLIC PANEL
Anion gap: 14 (ref 5–15)
BUN: 5 mg/dL — ABNORMAL LOW (ref 6–20)
CO2: 20 mmol/L — ABNORMAL LOW (ref 22–32)
Calcium: 8.5 mg/dL — ABNORMAL LOW (ref 8.9–10.3)
Chloride: 102 mmol/L (ref 98–111)
Creatinine, Ser: 0.71 mg/dL (ref 0.44–1.00)
GFR, Estimated: >60 mL/min (ref >60)
Glucose, Bld: 82 mg/dL (ref 70–99)
Potassium: 3.9 mmol/L (ref 3.5–5.1)
Sodium: 136 mmol/L (ref 135–145)

## 2020-04-12 LAB — CBC
HCT: 30.6 % — ABNORMAL LOW (ref 36.0–46.0)
Hemoglobin: 10.1 g/dL — ABNORMAL LOW (ref 12.0–15.0)
MCH: 30 pg (ref 26.0–34.0)
MCHC: 33 g/dL (ref 30.0–36.0)
MCV: 90.8 fL (ref 80.0–100.0)
Platelets: 269 10*3/uL (ref 150–400)
RBC: 3.37 MIL/uL — ABNORMAL LOW (ref 3.87–5.11)
RDW: 12.4 % (ref 11.5–15.5)
WBC: 17.8 10*3/uL — ABNORMAL HIGH (ref 4.0–10.5)
nRBC: 0 % (ref 0.0–0.2)

## 2020-04-12 LAB — MAGNESIUM: Magnesium: 2.2 mg/dL (ref 1.7–2.4)

## 2020-04-12 MED ORDER — FENTANYL CITRATE (PF) 100 MCG/2ML IJ SOLN
INTRAMUSCULAR | Status: AC
  Filled 2020-04-12: qty 2

## 2020-04-12 MED ORDER — FENTANYL CITRATE (PF) 100 MCG/2ML IJ SOLN
INTRAMUSCULAR | Status: AC | PRN
  Administered 2020-04-12: 25 ug via INTRAVENOUS
  Administered 2020-04-12: 50 ug via INTRAVENOUS

## 2020-04-12 MED ORDER — MIDAZOLAM HCL 2 MG/2ML IJ SOLN
INTRAMUSCULAR | Status: AC | PRN
  Administered 2020-04-12: 0.5 mg via INTRAVENOUS
  Administered 2020-04-12: 1 mg via INTRAVENOUS

## 2020-04-12 MED ORDER — MIDAZOLAM HCL 2 MG/2ML IJ SOLN
INTRAMUSCULAR | Status: AC
  Filled 2020-04-12: qty 2

## 2020-04-12 MED ORDER — SODIUM CHLORIDE 0.9% FLUSH
5.0000 mL | Freq: Three times a day (TID) | INTRAVENOUS | Status: DC
  Administered 2020-04-12 – 2020-04-16 (×12): 5 mL

## 2020-04-12 NOTE — Procedures (Signed)
Interventional Radiology Procedure Note  Procedure:   Ct guided RLQ drain, for ruptured appy. New 74F drain to bulb.  Cx sent.  Complications: None  Recommendations:  - To bulb suction - Do not submerge - Routine drain care   Signed,  Yvone Neu. Loreta Ave, DO

## 2020-04-12 NOTE — Progress Notes (Signed)
Pt tolerating clear liquids well, advancing to full liquids.  Pt husband watched how to drain JP drain. Will need hands-on practice tomorrow. Pt can do it if IV is turned off so it doesn't beep.  3 mL of serosanguinous liquid drained since return from IR. 5 mL flush instilled.

## 2020-04-12 NOTE — Progress Notes (Signed)
[facility] Progress Note     Subjective: CC-  Feeling much better today. States that she still had some RLQ pain over night but it is resolved this morning. Denies n/v. Tolerated clear liquids yesterday. She has had a couple loose stools. WBC slightly down 17.8, afebrile Going for IR drain placement today.  Objective: Vital signs in last 24 hours: Temp:  [97.9 F (36.6 C)-98.8 F (37.1 C)] 97.9 F (36.6 C) (01/28 0507) Pulse Rate:  [71-96] 71 (01/28 0507) Resp:  [16-18] 17 (01/28 0507) BP: (90-99)/(56-62) 99/59 (01/28 0507) SpO2:  [98 %-100 %] 100 % (01/28 0507) Last BM Date: 04/11/20  Intake/Output from previous day: 01/27 0701 - 01/28 0700 In: 1084.2 [P.O.:118; I.V.:827.3; IV Piggyback:138.9] Out: -  Intake/Output this shift: No intake/output data recorded.  PE: General:Alert, NAD Heart:RRR Lungs: CTAB, no wheezes, rhonchi, or rales noted. Respiratory effort nonlabored Abd: soft,ND, NT, +BS, no masses, hernias, or organomegaly MS: all 4 extremities are symmetrical with no cyanosis, clubbing, or edema. Skin: warm and dry with no masses, lesions, or rashes Psych: A&Ox3 with an appropriate affect.  Lab Results:  Recent Labs    04/11/20 0239 04/12/20 0111  WBC 18.8* 17.8*  HGB 10.2* 10.1*  HCT 31.6* 30.6*  PLT 256 269   BMET Recent Labs    04/11/20 0239 04/12/20 0111  NA 133* 136  K 4.1 3.9  CL 102 102  CO2 19* 20*  GLUCOSE 90 82  BUN <5* 5*  CREATININE 0.74 0.71  CALCIUM 8.6* 8.5*   PT/INR No results for input(s): LABPROT, INR in the last 72 hours. CMP     Component Value Date/Time   NA 136 04/12/2020 0111   NA 139 04/05/2018 0953   K 3.9 04/12/2020 0111   CL 102 04/12/2020 0111   CO2 20 (L) 04/12/2020 0111   GLUCOSE 82 04/12/2020 0111   BUN 5 (L) 04/12/2020 0111   BUN 9 04/05/2018 0953   CREATININE 0.71 04/12/2020 0111   CALCIUM 8.5 (L) 04/12/2020 0111   PROT 8.3 (H) 04/08/2020 1403   ALBUMIN 3.4 (L) 04/08/2020 1403    AST 13 (L) 04/08/2020 1403   ALT 17 04/08/2020 1403   ALKPHOS 65 04/08/2020 1403   BILITOT 0.6 04/08/2020 1403   GFRNONAA >60 04/12/2020 0111   GFRAA 139 04/05/2018 0953   Lipase     Component Value Date/Time   LIPASE 29 04/08/2020 1403       Studies/Results: CT ABDOMEN PELVIS W CONTRAST  Result Date: 04/11/2020 CLINICAL DATA:  RIGHT lower quadrant tenderness and abdominal abscess, follow-up EXAM: CT ABDOMEN AND PELVIS WITH CONTRAST TECHNIQUE: Multidetector CT imaging of the abdomen and pelvis was performed using the standard protocol following bolus administration of intravenous contrast. Sagittal and coronal MPR images reconstructed from axial data set. CONTRAST:  OMNIPAQUE IOHEXOL 300 MG/ML SOLN IV. Dilute oral contrast. COMPARISON:  04/08/2020 FINDINGS: Lower chest: Lung bases clear Hepatobiliary: Distended gallbladder without calcification or definite wall thickening. Liver unremarkable. No biliary dilatation. Pancreas: Normal appearance Spleen: Upper normal spleen size without focal abnormality Adrenals/Urinary Tract: Adrenal glands, kidneys, ureters, and bladder normal appearance Stomach/Bowel: Stomach decompressed. Again identified inflammatory process in the pericecal region with associated cecal wall thickening, appendiceal enlargement, and enhancing appendiceal walls consistent with retrocecal appendicitis. Multiloculated fluid collection identified consistent with periappendiceal abscess. Area of inflammatory changes measures overall 5.6 x 5.4 x 9.2 cm in size. Few adjacent reactive lymph nodes. Associated mild bowel wall thickening of distal ileum. No evidence of  bowel obstruction. Remaining bowel loops normal appearance. Vascular/Lymphatic: Vascular structures patent. Aorta normal caliber. Reproductive: Uterus and ovaries unremarkable Other: Small amount of free fluid in pelvis. No free air. No hernia. Musculoskeletal: Unremarkable IMPRESSION: Acute inflammatory process  pericecal with cecal wall thickening, pericecal inflammation, enlarged enhancing appendix, and reactive edema of the distal ileum likely reflecting ruptured retrocecal appendicitis. Multiloculated periappendiceal abscess 5.6 x 5.4 x 9.2 cm in size, slightly increased from previous exam. Small amount of free fluid in pelvis. No other intra-abdominal or intrapelvic abnormalities. Electronically Signed   By: Ulyses Southward M.D.   On: 04/11/2020 13:30    Anti-infectives: Anti-infectives (From admission, onward)   Start     Dose/Rate Route Frequency Ordered Stop   04/09/20 0600  piperacillin-tazobactam (ZOSYN) IVPB 3.375 g        3.375 g 12.5 mL/hr over 240 Minutes Intravenous Every 8 hours 04/08/20 2226     04/08/20 2230  piperacillin-tazobactam (ZOSYN) IVPB 3.375 g        3.375 g 100 mL/hr over 30 Minutes Intravenous  Once 04/08/20 2216 04/08/20 2324       Assessment/Plan Mild elevation of beta Hcg on istat - qualitativepregnancy test negative Anemia - Hgb 10.1, stable  Ruptured appendicitis with abscess - CT 1/24 with complex process in RLQ. IR consulted and states this is not amenable to perc drainageat this time, recommend repeat CT in 48 hours - repeat CT 1/27 showed acute inflammatory process pericecal with cecal wall thickening,  pericecal inflammation, enlarged enhancing appendix, and reactive edema of the distal ileum likely reflecting ruptured retrocecal appendicitis; multiloculated periappendiceal abscess 5.6 x 5.4 x 9.2 cm in size - WBC17.8 from 18.8, afebrile - IR to place drain today. Continue IV antibiotics. Ok to restart CLD and advance as tolerated to FLD after procedure. -Will then plan for eventual interval appendectomy as an outpatient   FEN:NPO for procedure, IVF@125cc /hr VTE: SCDs, lovenox ID: [email or web address] 1/24>>  Follow up: Dr. Fredricka Bonine, IR   LOS: 4 days    Franne Forts, [facility] 04/12/2020, 8:39 AM Please see Amion for pager number during  day hours 7:00am-4:30pm

## 2020-04-12 NOTE — Progress Notes (Signed)
Off unit to IR for drain placement

## 2020-04-13 LAB — CBC
HCT: 27.6 % — ABNORMAL LOW (ref 36.0–46.0)
Hemoglobin: 9 g/dL — ABNORMAL LOW (ref 12.0–15.0)
MCH: 29.4 pg (ref 26.0–34.0)
MCHC: 32.6 g/dL (ref 30.0–36.0)
MCV: 90.2 fL (ref 80.0–100.0)
Platelets: 232 10*3/uL (ref 150–400)
RBC: 3.06 MIL/uL — ABNORMAL LOW (ref 3.87–5.11)
RDW: 12.5 % (ref 11.5–15.5)
WBC: 11.9 10*3/uL — ABNORMAL HIGH (ref 4.0–10.5)
nRBC: 0 % (ref 0.0–0.2)

## 2020-04-13 LAB — BASIC METABOLIC PANEL
Anion gap: 13 (ref 5–15)
BUN: 6 mg/dL (ref 6–20)
CO2: 19 mmol/L — ABNORMAL LOW (ref 22–32)
Calcium: 8.1 mg/dL — ABNORMAL LOW (ref 8.9–10.3)
Chloride: 105 mmol/L (ref 98–111)
Creatinine, Ser: 0.61 mg/dL (ref 0.44–1.00)
GFR, Estimated: >60 mL/min (ref >60)
Glucose, Bld: 81 mg/dL (ref 70–99)
Potassium: 3.6 mmol/L (ref 3.5–5.1)
Sodium: 137 mmol/L (ref 135–145)

## 2020-04-13 NOTE — Progress Notes (Signed)
Progress Note: General Surgery Service   Chief Complaint/Subjective: Pain much improved, pain with coughing around drain lastnight, tolerating liquids  Objective: Vital signs in last 24 hours: Temp:  [97.5 F (36.4 C)-98.6 F (37 C)] 97.9 F (36.6 C) (01/29 0608) Pulse Rate:  [64-80] 64 (01/29 0608) Resp:  [15-19] 17 (01/29 0608) BP: (91-103)/(51-64) 91/56 (01/29 0608) SpO2:  [99 %-100 %] 99 % (01/29 0608) Last BM Date: 04/11/20  Intake/Output from previous day: 01/28 0701 - 01/29 0700 In: 120 [P.O.:120] Out: 23 [Drains:23] Intake/Output this shift: No intake/output data recorded.  Gen: NAD  Resp: nonlabored  Card: RRR  Abd: soft, NT, R sided drain with purulent output  Lab Results: CBC  Recent Labs    04/12/20 0111 04/13/20 0040  WBC 17.8* 11.9*  HGB 10.1* 9.0*  HCT 30.6* 27.6*  PLT 269 232   BMET Recent Labs    04/12/20 0111 04/13/20 0040  NA 136 137  K 3.9 3.6  CL 102 105  CO2 20* 19*  GLUCOSE 82 81  BUN 5* 6  CREATININE 0.71 0.61  CALCIUM 8.5* 8.1*   PT/INR No results for input(s): LABPROT, INR in the last 72 hours. ABG No results for input(s): PHART, HCO3 in the last 72 hours.  Invalid input(s): PCO2, PO2  Anti-infectives: Anti-infectives (From admission, onward)   Start     Dose/Rate Route Frequency Ordered Stop   04/09/20 0600  piperacillin-tazobactam (ZOSYN) IVPB 3.375 g        3.375 g 12.5 mL/hr over 240 Minutes Intravenous Every 8 hours 04/08/20 2226     04/08/20 2230  piperacillin-tazobactam (ZOSYN) IVPB 3.375 g        3.375 g 100 mL/hr over 30 Minutes Intravenous  Once 04/08/20 2216 04/08/20 2324      Medications: Scheduled Meds: . enoxaparin (LOVENOX) injection  40 mg Subcutaneous Q24H  . pantoprazole (PROTONIX) IV  40 mg Intravenous QHS  . sodium chloride flush  5 mL Intracatheter Q8H   Continuous Infusions: . 0.9 % NaCl with KCl 20 mEq / L 125 mL/hr at 04/12/20 2148  . methocarbamol (ROBAXIN) IV 500 mg (04/10/20 1901)   . piperacillin-tazobactam (ZOSYN)  IV 3.375 g (04/13/20 0541)   PRN Meds:.acetaminophen **OR** acetaminophen, diphenhydrAMINE **OR** diphenhydrAMINE, HYDROmorphone (DILAUDID) injection, methocarbamol (ROBAXIN) IV, ondansetron **OR** ondansetron (ZOFRAN) IV, oxyCODONE  Assessment/Plan: Mild elevation of beta Hcg on istat - qualitativepregnancy test negative Anemia - Hgb9.0, stable  Ruptured appendicitis with abscess - CT 1/24 with complex process in RLQ. IR consulted and states this is not amenable to perc drainageat this time, recommend repeat CT in 48 hours - repeat CT 1/27 showed acute inflammatory process pericecal with cecal wall thickening,  pericecal inflammation, enlarged enhancing appendix, and reactive edema of the distal ileum likely reflecting ruptured retrocecal appendicitis; multiloculated periappendiceal abscess 5.6 x 5.4 x 9.2 cm in size - WBC17.8 from 18.8, afebrile - IR drain 1/28. Continue IV antibiotics.  - continue full liquids -Will then plan for eventual interval appendectomy as an outpatient   YDX:AJOI liquids, IVF@125cc /hr VTE: SCDs, lovenox ID: [email or web address] 1/24>>  Follow up: Dr. Fredricka Bonine, IR   LOS: 5 days   Rodman Pickle, MD [phone] [facility]

## 2020-04-14 LAB — AEROBIC/ANAEROBIC CULTURE W GRAM STAIN (SURGICAL/DEEP WOUND)

## 2020-04-14 MED ORDER — SODIUM CHLORIDE 0.9 % IV SOLN
2.0000 g | INTRAVENOUS | Status: DC
  Administered 2020-04-14 – 2020-04-15 (×2): 2 g via INTRAVENOUS
  Filled 2020-04-14 (×3): qty 20

## 2020-04-14 NOTE — Progress Notes (Signed)
Pt was given Dilaudid 0.5 mg IV at 0801 this am. Wasted 0.5 mg with Marcy Siren. Called Pharmacy and notified them that the Dilaudid did not show up in the Pyxis as being taken out.

## 2020-04-14 NOTE — Progress Notes (Signed)
Progress Note: General Surgery Service   Chief Complaint/Subjective: Pain worse this morning, vomiting overnight  Objective: Vital signs in last 24 hours: Temp:  [97.7 F (36.5 C)-98.2 F (36.8 C)] 97.7 F (36.5 C) (01/30 0430) Pulse Rate:  [59-70] 59 (01/30 0430) Resp:  [18] 18 (01/30 0430) BP: (91-92)/(59-61) 92/59 (01/30 0430) SpO2:  [98 %-100 %] 99 % (01/30 0430) Last BM Date: 04/12/20  Intake/Output from previous day: 01/29 0701 - 01/30 0700 In: -  Out: 10 [Drains:10] Intake/Output this shift: No intake/output data recorded.  Gen: NAD  Resp: nonlabored  Card: bradycardic  Abd: drain in place, tender in lower abdomen  Lab Results: CBC  Recent Labs    04/12/20 0111 04/13/20 0040  WBC 17.8* 11.9*  HGB 10.1* 9.0*  HCT 30.6* 27.6*  PLT 269 232   BMET Recent Labs    04/12/20 0111 04/13/20 0040  NA 136 137  K 3.9 3.6  CL 102 105  CO2 20* 19*  GLUCOSE 82 81  BUN 5* 6  CREATININE 0.71 0.61  CALCIUM 8.5* 8.1*   PT/INR No results for input(s): LABPROT, INR in the last 72 hours. ABG No results for input(s): PHART, HCO3 in the last 72 hours.  Invalid input(s): PCO2, PO2  Anti-infectives: Anti-infectives (From admission, onward)   Start     Dose/Rate Route Frequency Ordered Stop   04/09/20 0600  piperacillin-tazobactam (ZOSYN) IVPB 3.375 g        3.375 g 12.5 mL/hr over 240 Minutes Intravenous Every 8 hours 04/08/20 2226     04/08/20 2230  piperacillin-tazobactam (ZOSYN) IVPB 3.375 g        3.375 g 100 mL/hr over 30 Minutes Intravenous  Once 04/08/20 2216 04/08/20 2324      Medications: Scheduled Meds: . enoxaparin (LOVENOX) injection  40 mg Subcutaneous Q24H  . pantoprazole (PROTONIX) IV  40 mg Intravenous QHS  . sodium chloride flush  5 mL Intracatheter Q8H   Continuous Infusions: . 0.9 % NaCl with KCl 20 mEq / L 125 mL/hr at 04/14/20 0457  . methocarbamol (ROBAXIN) IV 500 mg (04/10/20 1901)  . piperacillin-tazobactam (ZOSYN)  IV 3.375 g  (04/14/20 0503)   PRN Meds:.acetaminophen **OR** acetaminophen, diphenhydrAMINE **OR** diphenhydrAMINE, HYDROmorphone (DILAUDID) injection, methocarbamol (ROBAXIN) IV, ondansetron **OR** ondansetron (ZOFRAN) IV, oxyCODONE  Assessment/Plan:  Ruptured appendicitis with abscess - CT 1/24 with complex process in RLQ. IR consulted and states this is not amenable to perc drainageat this time, recommend repeat CT in 48 hours - repeat CT 1/27 showed acute inflammatory process pericecal with cecal wall thickening,  pericecal inflammation, enlarged enhancing appendix, and reactive edema of the distal ileum likely reflecting ruptured retrocecal appendicitis; multiloculated periappendiceal abscess 5.6 x 5.4 x 9.2 cm in size - IR drain 1/28. Continue IV antibiotics.  - continue full liquids -Will then plan for eventual interval appendectomy as an outpatient   WYO:VZCH liquids, IVF@125cc /hr VTE: SCDs, lovenox ID: [email or web address] 1/24>> Follow up: Dr. Fredricka Bonine, IR   LOS: 6 days   Rodman Pickle, MD [phone] [facility]

## 2020-04-15 LAB — CBC
HCT: 26.7 % — ABNORMAL LOW (ref 36.0–46.0)
Hemoglobin: 9.1 g/dL — ABNORMAL LOW (ref 12.0–15.0)
MCH: 30.6 pg (ref 26.0–34.0)
MCHC: 34.1 g/dL (ref 30.0–36.0)
MCV: 89.9 fL (ref 80.0–100.0)
Platelets: 239 10*3/uL (ref 150–400)
RBC: 2.97 MIL/uL — ABNORMAL LOW (ref 3.87–5.11)
RDW: 12.3 % (ref 11.5–15.5)
WBC: 7.9 10*3/uL (ref 4.0–10.5)
nRBC: 0 % (ref 0.0–0.2)

## 2020-04-15 MED ORDER — ENSURE ENLIVE PO LIQD
237.0000 mL | Freq: Two times a day (BID) | ORAL | Status: DC
  Administered 2020-04-16: 237 mL via ORAL

## 2020-04-15 NOTE — Progress Notes (Signed)
Referring Physician(s): Dr Darnelle Spangle  Supervising Physician: Marliss Coots  Patient Status:  [facility] - In-pt  Chief Complaint:  Ruptured appendicitis- with abscess  Subjective:  Drain placed in IR 1/28 Up in room Ambulating on own Soft diet To change to po meds soon per CCS    Allergies: Patient has no known allergies.  Medications: Prior to Admission medications   Not on File     Vital Signs: BP 100/64 (BP Location: Left Arm)   Pulse 63   Temp 98.1 F (36.7 C) (Oral)   Resp 17   Ht 5\' 2"  (1.575 m)   Wt 106 lb (48.1 kg)   LMP 04/01/2020 (Approximate)   SpO2 99%   BMI 19.39 kg/m   Physical Exam Skin:    General: Skin is warm.     Comments: Site of drain is clean and dry NT no bleeding No sign of infection  OP milky yellow Organism ID, Bacteria ESCHERICHIA COLI       Imaging: CT ABDOMEN PELVIS W CONTRAST  Result Date: 04/11/2020 CLINICAL DATA:  RIGHT lower quadrant tenderness and abdominal abscess, follow-up EXAM: CT ABDOMEN AND PELVIS WITH CONTRAST TECHNIQUE: Multidetector CT imaging of the abdomen and pelvis was performed using the standard protocol following bolus administration of intravenous contrast. Sagittal and coronal MPR images reconstructed from axial data set. CONTRAST:  04/13/2020 OMNIPAQUE IOHEXOL 300 MG/ML SOLN IV. Dilute oral contrast. COMPARISON:  04/08/2020 FINDINGS: Lower chest: Lung bases clear Hepatobiliary: Distended gallbladder without calcification or definite wall thickening. Liver unremarkable. No biliary dilatation. Pancreas: Normal appearance Spleen: Upper normal spleen size without focal abnormality Adrenals/Urinary Tract: Adrenal glands, kidneys, ureters, and bladder normal appearance Stomach/Bowel: Stomach decompressed. Again identified inflammatory process in the pericecal region with associated cecal wall thickening, appendiceal enlargement, and enhancing appendiceal walls consistent with retrocecal appendicitis. Multiloculated  fluid collection identified consistent with periappendiceal abscess. Area of inflammatory changes measures overall 5.6 x 5.4 x 9.2 cm in size. Few adjacent reactive lymph nodes. Associated mild bowel wall thickening of distal ileum. No evidence of bowel obstruction. Remaining bowel loops normal appearance. Vascular/Lymphatic: Vascular structures patent. Aorta normal caliber. Reproductive: Uterus and ovaries unremarkable Other: Small amount of free fluid in pelvis. No free air. No hernia. Musculoskeletal: Unremarkable IMPRESSION: Acute inflammatory process pericecal with cecal wall thickening, pericecal inflammation, enlarged enhancing appendix, and reactive edema of the distal ileum likely reflecting ruptured retrocecal appendicitis. Multiloculated periappendiceal abscess 5.6 x 5.4 x 9.2 cm in size, slightly increased from previous exam. Small amount of free fluid in pelvis. No other intra-abdominal or intrapelvic abnormalities. Electronically Signed   By: 04/10/2020 M.D.   On: 04/11/2020 13:30   CT IMAGE GUIDED DRAINAGE BY PERCUTANEOUS CATHETER  Result Date: 04/12/2020 INDICATION: 35 year old female with abscess at a ruptured appendicitis site EXAM: CT GUIDED DRAINAGE OF  ABSCESS MEDICATIONS: The patient is currently admitted to the hospital and receiving intravenous antibiotics. The antibiotics were administered within an appropriate time frame prior to the initiation of the procedure. ANESTHESIA/SEDATION: 1.5 mg IV Versed 75 mcg IV Fentanyl Moderate Sedation Time:  18 minutes The patient was continuously monitored during the procedure by the interventional radiology nurse under my direct supervision. COMPLICATIONS: None TECHNIQUE: Informed written consent was obtained from the patient after a thorough discussion of the procedural risks, benefits and alternatives. All questions were addressed. Maximal Sterile Barrier Technique was utilized including caps, mask, sterile gowns, sterile gloves, sterile drape,  hand hygiene and skin antiseptic. A timeout was performed prior to  the initiation of the procedure. PROCEDURE: The right lower quadrant was prepped with chlorhexidine in a sterile fashion, and a sterile drape was applied covering the operative field. A sterile gown and sterile gloves were used for the procedure. Local anesthesia was provided with 1% Lidocaine. Patient was prepped and draped in the usual sterile fashion. Scout images were acquired. Using CT guidance, trocar needle was advanced into the abscess of the right lower quadrant. Modified Seldinger technique was used to place a 10 [place] drainage catheter into the abscess of the right lower quadrant. Purulence fluid was aspirated confirming location. Pigtail drainage catheter was formed and a final CT was completed confirming location. The catheter was attached to bulb suction. Patient tolerated the procedure well and remained hemodynamically stable throughout. No complications were encountered and no significant blood loss. FINDINGS: Initial CT without contrast demonstrates complex fluid of the right lower quadrant. Comparison with the previous CT with contrast was required to target the abscess of the right lower quadrant/pelvis. Final image demonstrates pigtail formed within the deep aspect of the abscess. Approximately 5 cc of purulent fluid aspirated for a culture. IMPRESSION: Status post CT-guided drainage of right lower quadrant abscess. Signed, Yvone Neu. Reyne Dumas, RPVI Vascular and Interventional Radiology Special[facility] Radiology Electronically Signed   By: Gilmer Mor D.O.   On: 04/12/2020 18:00    Labs:  CBC: Recent Labs    04/11/20 0239 04/12/20 0111 04/13/20 0040 04/15/20 0240  WBC 18.8* 17.8* 11.9* 7.9  HGB 10.2* 10.1* 9.0* 9.1*  HCT 31.6* 30.6* 27.6* 26.7*  PLT 256 269 232 239    COAGS: Recent Labs    04/09/20 0350  INR 1.4*    BMP: Recent Labs    04/10/20 0743 04/11/20 0239 04/12/20 0111 04/13/20 0040   NA 133* 133* 136 137  K 4.3 4.1 3.9 3.6  CL 104 102 102 105  CO2 18* 19* 20* 19*  GLUCOSE 74 90 82 81  BUN <5* <5* 5* 6  CALCIUM 8.4* 8.6* 8.5* 8.1*  CREATININE 0.77 0.74 0.71 0.61  GFRNONAA >60 >60 >60 >60    LIVER FUNCTION TESTS: Recent Labs    04/08/20 1403  BILITOT 0.6  AST 13*  ALT 17  ALKPHOS 65  PROT 8.3*  ALBUMIN 3.4*    Assessment and Plan:  RLQ abscess Drain intact If home with drain--- need to flush daily 5 cc sterile saline and record OP She will hear from IR OP Clinic scheduler for follow up time and date Orders in place  Electronically Signed: Robet Leu, PA-C 04/15/2020, 9:44 AM   I spent a total of 15 Minutes at the the patient's bedside AND on the patient's hospital floor or unit, greater than 50% of which was counseling/coordinating care for RLQ absc drain

## 2020-04-15 NOTE — Progress Notes (Addendum)
   Subjective/Chief Complaint: Having flatus, no more n/v, feels better, oob yesterday   Objective: Vital signs in last 24 hours: Temp:  [98.1 F (36.7 C)-98.3 F (36.8 C)] 98.1 F (36.7 C) (01/31 0413) Pulse Rate:  [62-71] 63 (01/31 0413) Resp:  [16-17] 17 (01/31 0413) BP: (98-101)/(64-68) 100/64 (01/31 0413) SpO2:  [99 %] 99 % (01/31 0413) Last BM Date: 04/12/20  Intake/Output from previous day: 01/30 0701 - 01/31 0700 In: 6471.4 [I.V.:6371.4; IV Piggyback:100] Out: 20 [Drains:20] Intake/Output this shift: No intake/output data recorded.  General appearance: no distress Resp: clear to auscultation bilaterally Cardio: regular rate and rhythm GI: soft nt drain with brownish output  Lab Results:  Recent Labs    04/13/20 0040 04/15/20 0240  WBC 11.9* 7.9  HGB 9.0* 9.1*  HCT 27.6* 26.7*  PLT 232 239   BMET Recent Labs    04/13/20 0040  NA 137  K 3.6  CL 105  CO2 19*  GLUCOSE 81  BUN 6  CREATININE 0.61  CALCIUM 8.1*   PT/INR No results for input(s): LABPROT, INR in the last 72 hours. ABG No results for input(s): PHART, HCO3 in the last 72 hours.  Invalid input(s): PCO2, PO2  Studies/Results: No results found.  Anti-infectives: Anti-infectives (From admission, onward)   Start     Dose/Rate Route Frequency Ordered Stop   04/14/20 1400  cefTRIAXone (ROCEPHIN) 2 g in sodium chloride 0.9 % 100 mL IVPB        2 g 200 mL/hr over 30 Minutes Intravenous Every 24 hours 04/14/20 1312     04/09/20 0600  piperacillin-tazobactam (ZOSYN) IVPB 3.375 g  Status:  Discontinued        3.375 g 12.5 mL/hr over 240 Minutes Intravenous Every 8 hours 04/08/20 2226 04/14/20 1312   04/08/20 2230  piperacillin-tazobactam (ZOSYN) IVPB 3.375 g        3.375 g 100 mL/hr over 30 Minutes Intravenous  Once 04/08/20 2216 04/08/20 2324      Assessment/Plan: Ruptured appendicitis with abscess - CT 1/24 with complex process in RLQ. IR consulted and states this is not amenable to  perc drainageat this time, recommended repeat CT in 48 hours - repeat CT 1/27 showed acute inflammatory process pericecal with cecal wall thickening, pericecal inflammation, enlarged enhancing appendix, and reactive edema of the distal ileum likely reflecting ruptured retrocecal appendicitis; multiloculated periappendiceal abscess 5.6 x 5.4 x 9.2 cm in size - s/p IRdrain 1/28. Continue IV antibiotics until tomorrow, wbc normal, possibly transition orals in am - soft diet today as we discussed -Will then plan for discussion of eventual interval appendectomy as an outpatient, did not have a fecalith  IWP:YKDX today, soft diet, supplements VTE: SCDs, lovenox ID: zosyn 1/24>>switched to rocephin 1/30 due to e coli sensitivities Follow up: Dr. Fredricka Bonine, IR   Natalie [name] 04/15/2020

## 2020-04-16 MED ORDER — SODIUM CHLORIDE 0.9% FLUSH: 5.0000 mL | Freq: Every day | INTRAVENOUS | 1 refills | Status: DC

## 2020-04-16 MED ORDER — METRONIDAZOLE 500 MG PO TABS: 500.0000 mg | ORAL_TABLET | Freq: Two times a day (BID) | ORAL | 0 refills | Status: DC

## 2020-04-16 MED ORDER — ACETAMINOPHEN 325 MG PO TABS: 650.0000 mg | ORAL_TABLET | Freq: Four times a day (QID) | ORAL | Status: DC | PRN

## 2020-04-16 MED ORDER — CEFDINIR 300 MG PO CAPS: 600.0000 mg | ORAL_CAPSULE | Freq: Every day | ORAL | 0 refills | Status: DC

## 2020-04-16 MED ORDER — OXYCODONE HCL 5 MG PO TABS: 5.0000 mg | ORAL_TABLET | Freq: Four times a day (QID) | ORAL | 0 refills | Status: DC | PRN
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
## 2020-04-16 NOTE — Progress Notes (Signed)
Referring Physician(s): Dr Darnelle Spangle  Supervising Physician: Dr. Miles Costain  Patient Status:  [facility] - In-pt  Chief Complaint:  Ruptured appendicitis- with abscess  Subjective:  Drain placed in IR 1/28 For DC today Husband at bedside    Allergies: Patient has no known allergies.  Medications:  Current Facility-Administered Medications:  .  acetaminophen (TYLENOL) tablet 650 mg, 650 mg, Oral, Q6H PRN, 650 mg at 04/14/20 1451 **OR** acetaminophen (TYLENOL) suppository 650 mg, 650 mg, Rectal, Q6H PRN, Violeta Gelinas, MD .  cefTRIAXone (ROCEPHIN) 2 g in sodium chloride 0.9 % 100 mL IVPB, 2 g, Intravenous, Q24H, Kinsinger, De Blanch, MD, Last Rate: 200 mL/hr at 04/15/20 1349, 2 g at 04/15/20 1349 .  diphenhydrAMINE (BENADRYL) capsule 25 mg, 25 mg, Oral, Q6H PRN **OR** diphenhydrAMINE (BENADRYL) injection 25 mg, 25 mg, Intravenous, Q6H PRN, Violeta Gelinas, MD .  enoxaparin (LOVENOX) injection 40 mg, 40 mg, Subcutaneous, Q24H, Allred, Darrell K, PA-C .  feeding supplement (ENSURE ENLIVE / ENSURE PLUS) liquid 237 mL, 237 mL, Oral, BID BM, Emelia Loron, MD, 237 mL at 04/16/20 0853 .  HYDROmorphone (DILAUDID) injection 0.5 mg, 0.5 mg, Intravenous, Q2H PRN, Violeta Gelinas, MD, 0.5 mg at 04/14/20 0801 .  methocarbamol (ROBAXIN) 500 mg in dextrose 5 % 50 mL IVPB, 500 mg, Intravenous, Q6H PRN, Meuth, Brooke A, PA-C, Last Rate: 100 mL/hr at 04/10/20 1901, 500 mg at 04/10/20 1901 .  ondansetron (ZOFRAN-ODT) disintegrating tablet 4 mg, 4 mg, Oral, Q6H PRN **OR** ondansetron (ZOFRAN) injection 4 mg, 4 mg, Intravenous, Q6H PRN, Violeta Gelinas, MD, 4 mg at 04/14/20 0056 .  oxyCODONE (Oxy IR/ROXICODONE) immediate release tablet 5-10 mg, 5-10 mg, Oral, Q4H PRN, Violeta Gelinas, MD, 10 mg at 04/13/20 2300 .  pantoprazole (PROTONIX) injection 40 mg, 40 mg, Intravenous, QHS, Violeta Gelinas, MD, 40 mg at 04/15/20 2213 .  sodium chloride flush (NS) 0.9 % injection 5 mL, 5 mL, Intracatheter, Q8H,  Wagner, Jaime, DO, 5 mL at 04/16/20 0532    Vital Signs: BP 96/60 (BP Location: Left Arm)   Pulse 66   Temp 98.1 F (36.7 C) (Oral)   Resp 20   Ht 5\' 2"  (1.575 m)   Wt 48.1 kg   LMP 04/01/2020 (Approximate)   SpO2 98%   BMI 19.39 kg/m   Physical Exam Skin:    General: Skin is warm.     Comments: Site of drain is clean and dry NT no bleeding No sign of infection  OP milky yellow     Imaging: CT IMAGE GUIDED DRAINAGE BY PERCUTANEOUS CATHETER  Result Date: 04/12/2020 INDICATION: 35 year old female with abscess at a ruptured appendicitis site EXAM: CT GUIDED DRAINAGE OF  ABSCESS MEDICATIONS: The patient is currently admitted to the hospital and receiving intravenous antibiotics. The antibiotics were administered within an appropriate time frame prior to the initiation of the procedure. ANESTHESIA/SEDATION: 1.5 mg IV Versed 75 mcg IV Fentanyl Moderate Sedation Time:  18 minutes The patient was continuously monitored during the procedure by the interventional radiology nurse under my direct supervision. COMPLICATIONS: None TECHNIQUE: Informed written consent was obtained from the patient after a thorough discussion of the procedural risks, benefits and alternatives. All questions were addressed. Maximal Sterile Barrier Technique was utilized including caps, mask, sterile gowns, sterile gloves, sterile drape, hand hygiene and skin antiseptic. A timeout was performed prior to the initiation of the procedure. PROCEDURE: The right lower quadrant was prepped with chlorhexidine in a sterile fashion, and a sterile drape was applied covering the  operative field. A sterile gown and sterile gloves were used for the procedure. Local anesthesia was provided with 1% Lidocaine. Patient was prepped and draped in the usual sterile fashion. Scout images were acquired. Using CT guidance, trocar needle was advanced into the abscess of the right lower quadrant. Modified Seldinger technique was used to place a  10 [place] drainage catheter into the abscess of the right lower quadrant. Purulence fluid was aspirated confirming location. Pigtail drainage catheter was formed and a final CT was completed confirming location. The catheter was attached to bulb suction. Patient tolerated the procedure well and remained hemodynamically stable throughout. No complications were encountered and no significant blood loss. FINDINGS: Initial CT without contrast demonstrates complex fluid of the right lower quadrant. Comparison with the previous CT with contrast was required to target the abscess of the right lower quadrant/pelvis. Final image demonstrates pigtail formed within the deep aspect of the abscess. Approximately 5 cc of purulent fluid aspirated for a culture. IMPRESSION: Status post CT-guided drainage of right lower quadrant abscess. Signed, Yvone Neu. Reyne Dumas, RPVI Vascular and Interventional Radiology Specialists [facility] Radiology Electronically Signed   By: Gilmer Mor D.O.   On: 04/12/2020 18:00    Labs:  CBC: Recent Labs    04/11/20 0239 04/12/20 0111 04/13/20 0040 04/15/20 0240  WBC 18.8* 17.8* 11.9* 7.9  HGB 10.2* 10.1* 9.0* 9.1*  HCT 31.6* 30.6* 27.6* 26.7*  PLT 256 269 232 239    COAGS: Recent Labs    04/09/20 0350  INR 1.4*    BMP: Recent Labs    04/10/20 0743 04/11/20 0239 04/12/20 0111 04/13/20 0040  NA 133* 133* 136 137  K 4.3 4.1 3.9 3.6  CL 104 102 102 105  CO2 18* 19* 20* 19*  GLUCOSE 74 90 82 81  BUN <5* <5* 5* 6  CALCIUM 8.4* 8.6* 8.5* 8.1*  CREATININE 0.77 0.74 0.71 0.61  GFRNONAA >60 >60 >60 >60    LIVER FUNCTION TESTS: Recent Labs    04/08/20 1403  BILITOT 0.6  AST 13*  ALT 17  ALKPHOS 65  PROT 8.3*  ALBUMIN 3.4*    Assessment and Plan:  RLQ abscess Drain intact If home with drain--- need to flush daily 5 cc sterile saline and record OP. She will hear from IR OP Clinic scheduler for follow up time and date Orders in place  Electronically  Signed: Brayton El, PA-C 04/16/2020, 11:03 AM   I spent a total of 15 Minutes at the the patient's bedside AND on the patient's hospital floor or unit, greater than 50% of which was counseling/coordinating care for RLQ absc drain

## 2020-04-16 NOTE — Discharge Instructions (Signed)
Appendicitis, Adult Call if you have recurrent fever, increased pain, nausea or vomiting.   Call the office and let them know when your having your repeat CT scan. Do not drink alcohol while taking Flagyl Do not breastfeed while taking Oxycodone or Flagyl   The appendix is a tube in the body that is shaped like a finger. It is attached to the large intestine. Appendicitis means that this tube is swollen (inflamed). If this is not treated, the tube can tear (rupture). This can lead to a life-threatening infection. This condition can also cause pus to build up in the appendix (abscess). What are the causes? This condition may be caused by something that blocks the appendix. These include:  A ball of poop (stool).  Lymph glands that are bigger than normal. Sometimes the cause is not known. What increases the risk? You are more likely to develop this condition if you are between 71 and 80 years of age. What are the signs or symptoms? Symptoms of this condition include:  Pain around the belly button. ? The pain moves toward the lower right belly (abdomen). ? The pain can get worse with time. ? The pain can get worse if you cough. ? The pain can get worse if you move suddenly.  Tenderness in the lower right belly.  Feeling sick to your stomach (nauseous).  Vomiting.  Not feeling hungry (loss of appetite).  A fever.  Having trouble pooping (constipation).  Watery poop (diarrhea).  Not feeling well. How is this treated? Sometimes, this condition is treated with antibiotic medicines alone. But it is most often treated with both antibiotics and surgery to take out the appendix (appendectomy). If only antibiotics are given and the appendix is not taken out, there is a chance that the condition could come back. There are two ways to take out the appendix:  Open surgery. For this method, the appendix is taken out through a large cut (incision). The cut is made in the lower right belly.  This surgery may be used if: ? You have scars from another surgery. ? You have a bleeding condition. ? You are pregnant and will be having your baby soon. ? You have a condition that makes it hard to do the other type of surgery.  Laparoscopic surgery. For this method, the appendix is taken out through small cuts. Often, this surgery: ? Causes less pain. ? Causes fewer problems. ? Heals faster. If your appendix tears and pus forms:  A drain may be put into the sore. The drain will be used to get rid of the pus.  You may get an antibiotic through an IV tube.  Your appendix may or may not need to be taken out. Follow these instructions at home: If you had surgery, follow instructions from your doctor on how to:  Care for yourself at home.  Take care of your cut from surgery. Medicines  Take over-the-counter and prescription medicines only as told by your doctor.  If you were prescribed an antibiotic, take it as told by your doctor. Do not stop taking it even if you start to feel better. Eating and drinking Follow instructions from your doctor about what you cannot eat or drink. You may go back to your diet slowly if:  You no longer feel sick to your stomach.  You have stopped vomiting. General instructions  Do not use any products that contain nicotine or tobacco, such as cigarettes, e-cigarettes, and chewing tobacco. If you need help quitting,  ask your doctor.  Do not drive or use heavy machinery while taking prescription pain medicine.  Ask your doctor if the medicine you are taking can cause trouble pooping. You may need to take steps to prevent or treat trouble pooping: ? Drink enough fluid to keep your pee (urine) pale yellow. ? Take over-the-counter or prescription medicines. ? Eat foods that are high in fiber. These include beans, whole grains, and fresh fruits and vegetables. ? Limit foods that are high in fat and sugar. These include fried or sweet foods.  Keep  all follow-up visits as told by your doctor. This is important. Contact a doctor if:  There is pus, blood, or a lot of fluid coming from your cut or cuts from surgery.  You are sick to your stomach or you vomit. Get help right away if:  You have pain in your belly, and the pain is getting worse.  You have a fever.  You have chills.  You are very tired.  You have muscle pain.  You are short of breath. Summary  Appendicitis is swelling of the appendix. The appendix is a tube that is shaped like a finger. It is joined to the large intestine.  This condition may be caused by something that blocks the appendix. This can lead to an infection.  This condition is most often treated with both antibiotic medicines and taking out the appendix. This information is not intended to replace advice given to you by your health care provider. Make sure you discuss any questions you have with your health care provider. Document Revised: 05/29/2019 Document Reviewed: 08/18/2017 Elsevier Patient Education  2021 Elsevier Inc.   [facility] Surgical drains are used to remove extra fluid that normally builds up in a surgical wound after surgery. A surgical drain helps to heal a surgical wound. Different kinds of surgical drains include:  Active drains. These drains use suction to pull drainage away from the surgical wound. Drainage flows through a tube to a container outside of the body. With these drains, you need to keep the bulb or the drainage container flat (compressed) at all times, except while you empty it. Flattening the bulb or container creates suction.  Passive drains. These drains allow fluid to drain naturally, by gravity. Drainage flows through a tube to a bandage (dressing) or a container outside of the body. Passive drains do not need to be emptied. A drain is placed during surgery. Right after surgery, drainage is usually bright red and a little thicker than water. The drainage  may gradually turn yellow or pink and become thinner. It is likely that your health care provider will remove the drain when the drainage stops or when the amount decreases to 1-2 Tbsp (15-30 mL) during a 24-hour period. Supplies needed:  Tape.  Germ-free cleaning solution (sterile saline).  Cotton swabs.  Split gauze drain sponge: 4 x 4 inches (10 x 10 cm).  Gauze square: 4 x 4 inches (10 x 10 cm). How to care for your surgical drain Care for your drain as told by your health care provider. This is important to help prevent infection. If your drain is placed at your back, or any other hard-to-reach area, ask another person to assist you in performing the following tasks: General care  Keep the skin around the drain dry and covered with a dressing at all times.  Check your drain area every day for signs of infection. Check for: ? Redness, swelling, or pain. ?  Pus or a bad smell. ? Cloudy drainage. ? Tenderness or pressure at the drain exit site. Changing the dressing Follow instructions from your health care provider about how to change your dressing. Change your dressing at least once a day. Change it more often if needed to keep the dressing dry. Make sure you: 1. Gather your supplies. 2. Wash your hands with soap and water before you change your dressing. If soap and water are not available, use hand sanitizer. 3. Remove the old dressing. Avoid using scissors to do that. 4. Wash your hands with soap and water again after removing the old dressing. 5. Use sterile saline to clean your skin around the drain. You may need to use a cotton swab to clean the skin. 6. Place the tube through the slit in a drain sponge. Place the drain sponge so that it covers your wound. 7. Place the gauze square or another drain sponge on top of the drain sponge that is on the wound. Make sure the tube is between those layers. 8. Tape the dressing to your skin. 9. Tape the drainage tube to your skin 1-2  inches (2.5-5 cm) below the place where the tube enters your body. Taping keeps the tube from pulling on any stitches (sutures) that you have. 10. Wash your hands with soap and water. 11. Write down the color of your drainage and how often you change your dressing. How to empty your active drain 1. Make sure that you have a measuring cup that you can empty your drainage into. 2. Wash your hands with soap and water. If soap and water are not available, use hand sanitizer. 3. Loosen any pins or clips that hold the tube in place. 4. If your health care provider tells you to strip the tube to prevent clots and tube blockages: ? Hold the tube at the skin with one hand. Use your other hand to pinch the tubing with your thumb and first finger. ? Gently move your fingers down the tube while squeezing very lightly. This clears any drainage, clots, or tissue from the tube. ? You may need to do this several times each day to keep the tube clear. Do not pull on the tube. 5. Open the bulb cap or the drain plug. Do not touch the inside of the cap or the bottom of the plug. 6. Turn the device upside down and gently squeeze. 7. Empty all of the drainage into the measuring cup. 8. Compress the bulb or the container and replace the cap or the plug. To compress the bulb or the container, squeeze it firmly in the middle while you close the cap or plug the container. 9. Write down the amount of drainage that you have in each 24-hour period. If you have less than 2 Tbsp (30 mL) of drainage during 24 hours, contact your health care provider. 10. Flush the drainage down the toilet. 11. Wash your hands with soap and water.   Contact a health care provider if:  You have redness, swelling, or pain around your drain area.  You have pus or a bad smell coming from your drain area.  You have a fever or chills.  The skin around your drain is warm to the touch.  The amount of drainage that you have is increasing instead  of decreasing.  You have drainage that is cloudy.  There is a sudden stop or a sudden decrease in the amount of drainage that you have.  Your drain  tube falls out.  Your active drain does not stay compressed after you empty it. Summary  Surgical drains are used to remove extra fluid that normally builds up in a surgical wound after surgery.  Different kinds of surgical drains include active drains and passive drains. Active drains use suction to pull drainage away from the surgical wound, and passive drains allow fluid to drain naturally.  It is important to care for your drain to prevent infection. If your drain is placed at your back, or any other hard-to-reach area, ask another person to assist you.  Contact your health care provider if you have redness, swelling, or pain around your drain area. This information is not intended to replace advice given to you by your health care provider. Make sure you discuss any questions you have with your health care provider. Document Revised: 04/06/2018 Document Reviewed: 04/06/2018 Elsevier Patient Education  2021 [employer].

## 2020-04-17 ENCOUNTER — Other Ambulatory Visit: Payer: Self-pay | Admitting: Surgery

## 2020-04-17 DIAGNOSIS — K651 Peritoneal abscess: Secondary | ICD-10-CM

## 2020-04-25 ENCOUNTER — Ambulatory Visit
Admission: RE | Admit: 2020-04-25 | Discharge: 2020-04-25 | Disposition: A | Discharge status: Home / Self Care | Payer: BC Managed Care – PPO | Source: Ambulatory Visit | Attending: Radiology | Admitting: Radiology

## 2020-04-25 ENCOUNTER — Ambulatory Visit
Admission: RE | Admit: 2020-04-25 | Discharge: 2020-04-25 | Disposition: A | Discharge status: Home / Self Care | Payer: BC Managed Care – PPO | Source: Ambulatory Visit | Attending: Surgery | Admitting: Surgery

## 2020-04-25 DIAGNOSIS — K651 Peritoneal abscess: Secondary | ICD-10-CM

## 2020-04-25 HISTORY — PX: IR RADIOLOGIST EVAL & MGMT: IMG5224

## 2020-04-25 MED ORDER — IOPAMIDOL (ISOVUE-300) INJECTION 61%
100.0000 mL | Freq: Once | INTRAVENOUS | Status: AC | PRN
  Administered 2020-04-25: 100 mL via INTRAVENOUS

## 2020-04-25 NOTE — Progress Notes (Signed)
Referring Physician(s): Dr. Fredricka Bonine  Chief Complaint: The patient is seen in follow up today s/p RLQ abscess secondary to a ruptured appendix with drain placement 04/12/20.   History of present illness:  Natalie [name], 35 year old female with no significant past medical history, presented to the the ED 04/08/20 with persistent RLQ pain with intermittent fevers and malaise. Imaging revealed findings consistent with a ruptured appendix with an associated multiloculated periappendiceal abscess and she presented to IR for a RLQ drainage catheter on 04/12/20. She was discharged home from the hospital on 04/15/20.   She presents today to the outpatient clinic accompanied by her husband for a CT scan with drain injection. She is currently on oral antibiotics and he has been afebrile with no significant complaints except occasional discomfort related to the drain itself. She stopped flushing the drain four days ago because it was just leaking out around the drain insertion site.   Past Medical History:  Diagnosis Date  . Normal pregnancy, repeat 07/01/2011  . S/P laparoscopy 02/20/2015  . S/P laparoscopy 02/20/2015  . SVD (spontaneous vaginal delivery) 07/02/2011  . SVD (spontaneous vaginal delivery) 09/10/2016    Past Surgical History:  Procedure Laterality Date  . HYSTEROSCOPY N/A 01/07/2015   Procedure: HYSTEROSCOPY DIAGNOSTIC, ATTEMPTED REMOVAL OF iNTRAUTERINE DEVICE;  Surgeon: Sherian Rein, MD;  Location: [facility];  Service: Gynecology;  Laterality: N/A;  MD requests 1hr OR time  . IR RADIOLOGIST EVAL & MGMT  04/25/2020  . IUD REMOVAL N/A 02/20/2015   Procedure: INTRAUTERINE DEVICE (IUD) REMOVAL;  Surgeon: Sherian Rein, MD;  Location: [facility];  Service: Gynecology;  Laterality: N/A;  . LAPAROSCOPY N/A 02/20/2015   Procedure: LAPAROSCOPY OPERATIVE;  Surgeon: Sherian Rein, MD;  Location: [facility];  Service: Gynecology;  Laterality: N/A;  . NO PAST SURGERIES       Allergies: Patient has no known allergies.  Medications: Prior to Admission medications   Medication Sig Start Date End Date Taking? Authorizing Provider  acetaminophen (TYLENOL) 325 MG tablet Take 2 tablets (650 mg total) by mouth every 6 (six) hours as needed for mild pain. 04/16/20   Maczis, Elmer Sow, PA-C  cefdinir (OMNICEF) 300 MG capsule Take 2 capsules (600 mg total) by mouth daily. 04/16/20   Maczis, Elmer Sow, PA-C  metroNIDAZOLE (FLAGYL) 500 MG tablet Take 1 tablet (500 mg total) by mouth 2 (two) times daily with a meal. DO NOT CONSUME ALCOHOL WHILE TAKING THIS MEDICATION. 04/16/20   Maczis, Elmer Sow, PA-C  oxyCODONE (OXY IR/ROXICODONE) 5 MG immediate release tablet Take 1 tablet (5 mg total) by mouth every 6 (six) hours as needed. 04/16/20   Maczis, Elmer Sow, PA-C  sodium chloride flush (NS) 0.9 % SOLN 5 mLs by Intracatheter route daily. 04/16/20   Maczis, Elmer Sow, PA-C     No family history on file.  Social History   Socioeconomic History  . Marital status: Married    Spouse name: Not on file  . Number of children: Not on file  . Years of education: Not on file  . Highest education level: Not on file  Occupational History  . Not on file  Tobacco Use  . Smoking status: Never Smoker  . Smokeless tobacco: Never Used  Substance and Sexual Activity  . Alcohol use: No  . Drug use: No  . Sexual activity: Yes  Other Topics Concern  . Not on file  Social History Narrative  . Not on file   Social Determinants of Health   Financial Resource  Strain: Not on file  Food Insecurity: Not on file  Transportation Needs: Not on file  Physical Activity: Not on file  Stress: Not on file  Social Connections: Not on file     Vital Signs: BP 109/63   Pulse 79   Temp 98.6 F (37 C)   Resp 18   LMP 04/01/2020 (Approximate)   SpO2 99%   Physical Exam Constitutional:      General: She is not in acute distress. Pulmonary:     Effort: Pulmonary effort is normal.   Abdominal:     Palpations: Abdomen is soft.     Tenderness: There is abdominal tenderness.     Comments: RLQ drain to suction. Suture and stat-lock in place. Site is clean and dry.   Skin:    General: Skin is warm and dry.  Neurological:     Mental Status: She is alert and oriented to person, place, and time.  Psychiatric:        Mood and Affect: Mood is anxious. Affect is tearful.     Imaging: IR Radiologist Eval & Mgmt  Result Date: 04/25/2020 Please refer to notes tab for details about interventional procedure. (Op Note)   Labs:  CBC: Recent Labs    04/11/20 0239 04/12/20 0111 04/13/20 0040 04/15/20 0240  WBC 18.8* 17.8* 11.9* 7.9  HGB 10.2* 10.1* 9.0* 9.1*  HCT 31.6* 30.6* 27.6* 26.7*  PLT 256 269 232 239    COAGS: Recent Labs    04/09/20 0350  INR 1.4*    BMP: Recent Labs    04/10/20 0743 04/11/20 0239 04/12/20 0111 04/13/20 0040  NA 133* 133* 136 137  K 4.3 4.1 3.9 3.6  CL 104 102 102 105  CO2 18* 19* 20* 19*  GLUCOSE 74 90 82 81  BUN <5* <5* 5* 6  CALCIUM 8.4* 8.6* 8.5* 8.1*  CREATININE 0.77 0.74 0.71 0.61  GFRNONAA >60 >60 >60 >60    LIVER FUNCTION TESTS: Recent Labs    04/08/20 1403  BILITOT 0.6  AST 13*  ALT 17  ALKPHOS 65  PROT 8.3*  ALBUMIN 3.4*    Assessment:  RLQ abscess secondary to a ruptured appendix with drain placement 04/12/20: CT imaging today shows resolution of the abscess and contrast drain injection failed to show a fistula. Please see the full dictation of these images in Epic. Dr. Fredricka Bonine with the surgical team was consulted and a decision was made to remove the drain. This was done and the patient tolerated the removal well. The patient has a follow up appointment with Dr. Fredricka Bonine 05/09/20 and she is encouraged to keep this appointment. The patient was instructed to wait 24 hours before showering and to wait one week before submerging the site. She was instructed to notify her doctor if the site becomes red, tender,  swollen or has purulent output. She knows she can call the clinic with any questions or concerns.   Signed: Mickie Kay, NP 04/25/2020, 2:52 PM   Please refer to the attestation of this note by Dr. Grace Isaac for management and plan.

## 2020-05-09 ENCOUNTER — Ambulatory Visit: Payer: Self-pay | Admitting: Surgery

## 2020-05-09 NOTE — H&P (View-Only) (Signed)
Surgical Evaluation  Chief Complaint: appendicitis  HPI:   Very pleasant 35-year-old woman with history of perforated appendicitis requiring drain placement at the end of January. She recovered quickly and had her drain removed on February 10. She has been doing well since then. Has an occasional twinge of pain at the drain site. She is eating normally reports bowel movements, no fevers.  No Known Allergies  Past Medical History:  Diagnosis Date  . Normal pregnancy, repeat 07/01/2011  . S/P laparoscopy 02/20/2015  . S/P laparoscopy 02/20/2015  . SVD (spontaneous vaginal delivery) 07/02/2011  . SVD (spontaneous vaginal delivery) 09/10/2016    Past Surgical History:  Procedure Laterality Date  . HYSTEROSCOPY N/A 01/07/2015   Procedure: HYSTEROSCOPY DIAGNOSTIC, ATTEMPTED REMOVAL OF iNTRAUTERINE DEVICE;  Surgeon: Jody Bovard-Stuckert, MD;  Location: [facility];  Service: Gynecology;  Laterality: N/A;  MD requests 1hr OR time  . IR RADIOLOGIST EVAL & MGMT  04/25/2020  . IUD REMOVAL N/A 02/20/2015   Procedure: INTRAUTERINE DEVICE (IUD) REMOVAL;  Surgeon: Jody Bovard-Stuckert, MD;  Location: [facility];  Service: Gynecology;  Laterality: N/A;  . LAPAROSCOPY N/A 02/20/2015   Procedure: LAPAROSCOPY OPERATIVE;  Surgeon: Jody Bovard-Stuckert, MD;  Location: [facility];  Service: Gynecology;  Laterality: N/A;  . NO PAST SURGERIES      No family history on file.  Social History   Socioeconomic History  . Marital status: Married    Spouse name: Not on file  . Number of children: Not on file  . Years of education: Not on file  . Highest education level: Not on file  Occupational History  . Not on file  Tobacco Use  . Smoking status: Never Smoker  . Smokeless tobacco: Never Used  Substance and Sexual Activity  . Alcohol use: No  . Drug use: No  . Sexual activity: Yes  Other Topics Concern  . Not on file  Social History Narrative  . Not on file   Social Determinants of Health   Financial  Resource Strain: Not on file  Food Insecurity: Not on file  Transportation Needs: Not on file  Physical Activity: Not on file  Stress: Not on file  Social Connections: Not on file    Current Outpatient Medications on File Prior to Visit  Medication Sig Dispense Refill  . acetaminophen (TYLENOL) 325 MG tablet Take 2 tablets (650 mg total) by mouth every 6 (six) hours as needed for mild pain.    . cefdinir (OMNICEF) 300 MG capsule Take 2 capsules (600 mg total) by mouth daily. 20 capsule 0  . metroNIDAZOLE (FLAGYL) 500 MG tablet Take 1 tablet (500 mg total) by mouth 2 (two) times daily with a meal. DO NOT CONSUME ALCOHOL WHILE TAKING THIS MEDICATION. 20 tablet 0  . oxyCODONE (OXY IR/ROXICODONE) 5 MG immediate release tablet Take 1 tablet (5 mg total) by mouth every 6 (six) hours as needed. 12 tablet 0  . sodium chloride flush (NS) 0.9 % SOLN 5 mLs by Intracatheter route daily. 50 mL 1   No current facility-administered medications on file prior to visit.    Review of Systems: a complete, 10pt review of systems was completed with pertinent positives and negatives as documented in the HPI  Physical Exam: Vitals  Weight: 102.13 lb Height: 61.5in Body Surface Area: 1.43 m Body Mass Index: 18.98 kg/m  Temp.: 98F  Pulse: 110 (Regular)  P.OX: 97% (Room air) BP: 104/70(Sitting, Left Arm, Standard)  Alert well-appearing, respirations, abdomen nontender   CBC Latest Ref Rng &   Units 04/15/2020 04/13/2020 04/12/2020  WBC 4.0 - 10.5 K/uL 7.9 11.9(H) 17.8(H)  Hemoglobin 12.0 - 15.0 g/dL 3.5(K) 0.9(F) 10.1(L)  Hematocrit 36.0 - 46.0 % 26.7(L) 27.6(L) 30.6(L)  Platelets 150 - 400 K/uL 239 232 269    CMP Latest Ref Rng & Units 04/13/2020 04/12/2020 04/11/2020  Glucose 70 - 99 mg/dL 81 82 90  BUN 6 - 20 mg/dL 6 5(L) <8(H)  Creatinine 0.44 - 1.00 mg/dL 8.29 9.37 1.69  Sodium 135 - 145 mmol/L 137 136 133(L)  Potassium 3.5 - 5.1 mmol/L 3.6 3.9 4.1  Chloride 98 - 111 mmol/L 105 102 102   CO2 22 - 32 mmol/L 19(L) 20(L) 19(L)  Calcium 8.9 - 10.3 mg/dL 8.1(L) 8.5(L) 8.6(L)  Total Protein 6.5 - 8.1 g/dL - - -  Total Bilirubin 0.3 - 1.2 mg/dL - - -  Alkaline Phos 38 - 126 U/L - - -  AST 15 - 41 U/L - - -  ALT 0 - 44 U/L - - -    Lab Results  Component Value Date   INR 1.4 (H) 04/09/2020    Imaging: No results found.   A/P: APPENDICITIS ([id number]) Story: Has recovered very quickly after drain placement for perforated appendicitis with phlegmon and in the right lower quadrant. Drain remained to 2 weeks ago. Of note there was nonocclusive thrombus in the peripheral right internal iliac with some surrounding stranding and this was thought to be secondary to associated inflammation from the known phlegmon in the right lower quadrant. She has not had any symptoms related to this. I discussed the technique of the surgery and we discussed risks of bleeding, infection, pain, scarring, injury to intra-abdominal structures, conversion to open surgery or more extensive resection, delayed abscess or staple line leak, DVT/PE. Questions were welcomed and answered. Plan for interval appendectomy in 2-4 weeks.    Patient Active Problem List   Diagnosis Date Noted  . Ruptured appendicitis 04/08/2020  . Encounter for wellness examination in adult 11/15/2019  . Epistaxis 04/15/2018  . Weight loss 04/15/2018  . Contraception management 05/26/2017  . S/P laparoscopy 02/20/2015       Phylliss Blakes, MD [facility]  See AMION to contact appropriate on-call provider

## 2020-05-09 NOTE — H&P (Signed)
Surgical Evaluation  Chief Complaint: appendicitis  HPI:   Very pleasant 35 year old woman with history of perforated appendicitis requiring drain placement at the end of January. She recovered quickly and had her drain removed on February 10. She has been doing well since then. Has an occasional twinge of pain at the drain site. She is eating normally reports bowel movements, no fevers.  No Known Allergies  Past Medical History:  Diagnosis Date  . Normal pregnancy, repeat 07/01/2011  . S/P laparoscopy 02/20/2015  . S/P laparoscopy 02/20/2015  . SVD (spontaneous vaginal delivery) 07/02/2011  . SVD (spontaneous vaginal delivery) 09/10/2016    Past Surgical History:  Procedure Laterality Date  . HYSTEROSCOPY N/A 01/07/2015   Procedure: HYSTEROSCOPY DIAGNOSTIC, ATTEMPTED REMOVAL OF iNTRAUTERINE DEVICE;  Surgeon: Sherian Rein, MD;  Location: [facility];  Service: Gynecology;  Laterality: N/A;  MD requests 1hr OR time  . IR RADIOLOGIST EVAL & MGMT  04/25/2020  . IUD REMOVAL N/A 02/20/2015   Procedure: INTRAUTERINE DEVICE (IUD) REMOVAL;  Surgeon: Sherian Rein, MD;  Location: [facility];  Service: Gynecology;  Laterality: N/A;  . LAPAROSCOPY N/A 02/20/2015   Procedure: LAPAROSCOPY OPERATIVE;  Surgeon: Sherian Rein, MD;  Location: [facility];  Service: Gynecology;  Laterality: N/A;  . NO PAST SURGERIES      No family history on file.  Social History   Socioeconomic History  . Marital status: Married    Spouse name: Not on file  . Number of children: Not on file  . Years of education: Not on file  . Highest education level: Not on file  Occupational History  . Not on file  Tobacco Use  . Smoking status: Never Smoker  . Smokeless tobacco: Never Used  Substance and Sexual Activity  . Alcohol use: No  . Drug use: No  . Sexual activity: Yes  Other Topics Concern  . Not on file  Social History Narrative  . Not on file   Social Determinants of Health   Financial  Resource Strain: Not on file  Food Insecurity: Not on file  Transportation Needs: Not on file  Physical Activity: Not on file  Stress: Not on file  Social Connections: Not on file    Current Outpatient Medications on File Prior to Visit  Medication Sig Dispense Refill  . acetaminophen (TYLENOL) 325 MG tablet Take 2 tablets (650 mg total) by mouth every 6 (six) hours as needed for mild pain.    . cefdinir (OMNICEF) 300 MG capsule Take 2 capsules (600 mg total) by mouth daily. 20 capsule 0  . metroNIDAZOLE (FLAGYL) 500 MG tablet Take 1 tablet (500 mg total) by mouth 2 (two) times daily with a meal. DO NOT CONSUME ALCOHOL WHILE TAKING THIS MEDICATION. 20 tablet 0  . oxyCODONE (OXY IR/ROXICODONE) 5 MG immediate release tablet Take 1 tablet (5 mg total) by mouth every 6 (six) hours as needed. 12 tablet 0  . sodium chloride flush (NS) 0.9 % SOLN 5 mLs by Intracatheter route daily. 50 mL 1   No current facility-administered medications on file prior to visit.    Review of Systems: a complete, 10pt review of systems was completed with pertinent positives and negatives as documented in the HPI  Physical Exam: Vitals  Weight: 102.13 lb Height: 61.5in Body Surface Area: 1.43 m Body Mass Index: 18.98 kg/m  Temp.: 69F  Pulse: 110 (Regular)  P.OX: 97% (Room air) BP: 104/70(Sitting, Left Arm, Standard)  Alert well-appearing, respirations, abdomen nontender   CBC Latest Ref Rng &  Units 04/15/2020 04/13/2020 04/12/2020  WBC 4.0 - 10.5 K/uL 7.9 11.9(H) 17.8(H)  Hemoglobin 12.0 - 15.0 g/dL 3.5(K) 0.9(F) 10.1(L)  Hematocrit 36.0 - 46.0 % 26.7(L) 27.6(L) 30.6(L)  Platelets 150 - 400 K/uL 239 232 269    CMP Latest Ref Rng & Units 04/13/2020 04/12/2020 04/11/2020  Glucose 70 - 99 mg/dL 81 82 90  BUN 6 - 20 mg/dL 6 5(L) <8(H)  Creatinine 0.44 - 1.00 mg/dL 8.29 9.37 1.69  Sodium 135 - 145 mmol/L 137 136 133(L)  Potassium 3.5 - 5.1 mmol/L 3.6 3.9 4.1  Chloride 98 - 111 mmol/L 105 102 102   CO2 22 - 32 mmol/L 19(L) 20(L) 19(L)  Calcium 8.9 - 10.3 mg/dL 8.1(L) 8.5(L) 8.6(L)  Total Protein 6.5 - 8.1 g/dL - - -  Total Bilirubin 0.3 - 1.2 mg/dL - - -  Alkaline Phos 38 - 126 U/L - - -  AST 15 - 41 U/L - - -  ALT 0 - 44 U/L - - -    Lab Results  Component Value Date   INR 1.4 (H) 04/09/2020    Imaging: No results found.   A/P: APPENDICITIS (K37) Story: Has recovered very quickly after drain placement for perforated appendicitis with phlegmon and in the right lower quadrant. Drain remained to 2 weeks ago. Of note there was nonocclusive thrombus in the peripheral right internal iliac with some surrounding stranding and this was thought to be secondary to associated inflammation from the known phlegmon in the right lower quadrant. She has not had any symptoms related to this. I discussed the technique of the surgery and we discussed risks of bleeding, infection, pain, scarring, injury to intra-abdominal structures, conversion to open surgery or more extensive resection, delayed abscess or staple line leak, DVT/PE. Questions were welcomed and answered. Plan for interval appendectomy in 2-4 weeks.    Patient Active Problem List   Diagnosis Date Noted  . Ruptured appendicitis 04/08/2020  . Encounter for wellness examination in adult 11/15/2019  . Epistaxis 04/15/2018  . Weight loss 04/15/2018  . Contraception management 05/26/2017  . S/P laparoscopy 02/20/2015       Phylliss Blakes, MD [facility]  See AMION to contact appropriate on-call provider

## 2020-05-20 NOTE — Progress Notes (Addendum)
COVID Vaccine Completed: x2 Date COVID Vaccine completed:  11-13-19 &  9-21 Has received booster: COVID vaccine manufacturer: Pfizer       Date of COVID positive in last 59 days:N/A  PCP - Derrel Nip, MD Cardiologist - N/A  Chest x-ray - N/A EKG - N/A Stress Test -  ECHO -  Cardiac Cath -  Pacemaker/ICD device last checked: Spinal Cord Stimulator:  Sleep Study - N/A CPAP -   Fasting Blood Sugar - N/A Checks Blood Sugar _____ times a day  Blood Thinner Instructions:N/A Aspirin Instructions: Last Dose:  Activity level:  Can go up a flight of stairs and perform activities of daily living without stopping and without symptoms of chest pain or shortness of breath.  Able to exercise without symptoms    Anesthesia review: N/A  Patient denies shortness of breath, fever, cough and chest pain at PAT appointment   Patient verbalized understanding of instructions that were given to them at the PAT appointment. Patient was also instructed that they will need to review over the PAT instructions again at home before surgery.

## 2020-05-20 NOTE — Patient Instructions (Addendum)
DUE TO COVID-19 ONLY ONE VISITOR IS ALLOWED TO COME WITH YOU AND STAY IN THE WAITING ROOM ONLY DURING PRE OP AND PROCEDURE.     COVID SWAB TESTING MUST BE COMPLETED ON:  Friday, 05-24-20 @ 9:30 AM   [address] [place], [place] [address]  (Must self quarantine after testing. Follow instructions on handout.)        Your procedure is scheduled on:  Tuesday, 05-28-20   Report to [facility] Main  Entrance   Report to admitting at 7:45 AM   Call this number if you have problems the morning of surgery [phone]   Do not eat food :After Midnight.   May have liquids until 6:45 AM day of surgery  CLEAR LIQUID DIET  Foods Allowed                                                                     Foods Excluded  Water, Black Coffee and tea, regular and decaf              liquids that you cannot  Plain Jell-O in any flavor  (No red)                                    see through such as: Fruit ices (not with fruit pulp)                                      milk, soups, orange juice              Iced Popsicles (No red)                                      All solid food                                   Apple juices Sports drinks like Gatorade (No red) Lightly seasoned clear broth or consume(fat free) Sugar, honey syrup   Do NOT smoke after Midnight   Take these medicines the morning of surgery with A SIP OF WATER:  None                                You may not have any metal on your body including hair pins, jewelry, and body piercings             Do not wear make-up, lotions, powders, perfumes/cologne, or deodorant             Do not wear nail polish.  Do not shave  48 hours prior to surgery.            Do not bring valuables to the hospital. [facility] IS NOT             RESPONSIBLE   FOR VALUABLES.   Contacts, dentures or bridgework may not be worn  into surgery.   Patients discharged the day of surgery will not be allowed to drive home.               Please  read over the following fact sheets you were given: IF YOU HAVE QUESTIONS ABOUT YOUR PRE OP INSTRUCTIONS PLEASE CALL  [phone] [facility] - Preparing for Surgery Before surgery, you can play an important role.  Because skin is not sterile, your skin needs to be as free of germs as possible.  You can reduce the number of germs on your skin by washing with CHG (chlorahexidine gluconate) soap before surgery.  CHG is an antiseptic cleaner which kills germs and bonds with the skin to continue killing germs even after washing. Please DO NOT use if you have an allergy to CHG or antibacterial soaps.  If your skin becomes reddened/irritated stop using the CHG and inform your nurse when you arrive at Short Stay. Do not shave (including legs and underarms) for at least 48 hours prior to the first CHG shower.  You may shave your face/neck.  Please follow these instructions carefully:  1.  Shower with CHG Soap the night before surgery and the  morning of surgery.  2.  If you choose to wash your hair, wash your hair first as usual with your normal  shampoo.  3.  After you shampoo, rinse your hair and body thoroughly to remove the shampoo.                             4.  Use CHG as you would any other liquid soap.  You can apply chg directly to the skin and wash.  Gently with a scrungie or clean washcloth.  5.  Apply the CHG Soap to your body ONLY FROM THE NECK DOWN.   Do   not use on face/ open                           Wound or open sores. Avoid contact with eyes, ears mouth and   genitals (private parts).                       Wash face,  Genitals (private parts) with your normal soap.             6.  Wash thoroughly, paying special attention to the area where your    surgery  will be performed.  7.  Thoroughly rinse your body with warm water from the neck down.  8.  DO NOT shower/wash with your normal soap after using and rinsing off the CHG Soap.                9.  Pat yourself dry with a clean  towel.            10.  Wear clean pajamas.            11.  Place clean sheets on your bed the night of your first shower and do not  sleep with pets. Day of Surgery : Do not apply any lotions/deodorants the morning of surgery.  Please wear clean clothes to the hospital/surgery center.  FAILURE TO FOLLOW THESE INSTRUCTIONS MAY RESULT IN THE CANCELLATION OF YOUR SURGERY  PATIENT SIGNATURE_________________________________  NURSE SIGNATURE__________________________________  ________________________________________________________________________

## 2020-05-23 ENCOUNTER — Encounter (HOSPITAL_COMMUNITY): Payer: Self-pay

## 2020-05-23 ENCOUNTER — Encounter (HOSPITAL_COMMUNITY)
Admission: RE | Admit: 2020-05-23 | Discharge: 2020-05-23 | Disposition: A | Discharge status: Home / Self Care | Payer: BC Managed Care – PPO | Source: Ambulatory Visit | Attending: Surgery | Admitting: Surgery

## 2020-05-23 ENCOUNTER — Other Ambulatory Visit: Payer: Self-pay

## 2020-05-23 DIAGNOSIS — Z01812 Encounter for preprocedural laboratory examination: Secondary | ICD-10-CM | POA: Diagnosis present

## 2020-05-23 LAB — CBC
HCT: 35.5 % — ABNORMAL LOW (ref 36.0–46.0)
Hemoglobin: 11.1 g/dL — ABNORMAL LOW (ref 12.0–15.0)
MCH: 29.8 pg (ref 26.0–34.0)
MCHC: 31.3 g/dL (ref 30.0–36.0)
MCV: 95.4 fL (ref 80.0–100.0)
Platelets: 197 10*3/uL (ref 150–400)
RBC: 3.72 MIL/uL — ABNORMAL LOW (ref 3.87–5.11)
RDW: 13.4 % (ref 11.5–15.5)
WBC: 9.5 10*3/uL (ref 4.0–10.5)
nRBC: 0 % (ref 0.0–0.2)

## 2020-05-24 ENCOUNTER — Other Ambulatory Visit (HOSPITAL_COMMUNITY)
Admission: RE | Admit: 2020-05-24 | Discharge: 2020-05-24 | Disposition: A | Discharge status: Home / Self Care | Payer: BC Managed Care – PPO | Source: Ambulatory Visit | Attending: Surgery | Admitting: Surgery

## 2020-05-24 DIAGNOSIS — Z01812 Encounter for preprocedural laboratory examination: Secondary | ICD-10-CM | POA: Diagnosis not present

## 2020-05-24 DIAGNOSIS — Z20822 Contact with and (suspected) exposure to covid-19: Secondary | ICD-10-CM | POA: Insufficient documentation

## 2020-05-24 LAB — SARS CORONAVIRUS 2 (TAT 6-24 HRS): SARS Coronavirus 2: NEGATIVE

## 2020-05-27 MED ORDER — BUPIVACAINE LIPOSOME 1.3 % IJ SUSP
20.0000 mL | Freq: Once | INTRAMUSCULAR | Status: DC
  Filled 2020-05-27: qty 20

## 2020-05-27 NOTE — Anesthesia Preprocedure Evaluation (Addendum)
Anesthesia Evaluation  Patient identified by MRN, date of birth, ID band Patient awake    Reviewed: Allergy & Precautions, NPO status , Patient's Chart, lab work & pertinent test results  History of Anesthesia Complications Negative for: history of anesthetic complications  Airway Mallampati: II  TM Distance: >3 FB Neck ROM: Full    Dental  (+) Chipped,    Pulmonary neg pulmonary ROS,    Pulmonary exam normal        Cardiovascular negative cardio ROS Normal cardiovascular exam     Neuro/Psych negative neurological ROS  negative psych ROS   GI/Hepatic Neg liver ROS, Perforated appendicitis   Endo/Other  negative endocrine ROS  Renal/GU negative Renal ROS  negative genitourinary   Musculoskeletal negative musculoskeletal ROS (+)   Abdominal   Peds  Hematology negative hematology ROS (+)   Anesthesia Other Findings Day of surgery medications reviewed with patient.  Reproductive/Obstetrics negative OB ROS                            Anesthesia Physical Anesthesia Plan  ASA: II  Anesthesia Plan: General   Post-op Pain Management:    Induction: Intravenous  PONV Risk Score and Plan: 4 or greater and Treatment may vary due to age or medical condition, Ondansetron, Dexamethasone, Midazolam and Scopolamine patch - Pre-op  Airway Management Planned: Oral ETT  Additional Equipment: None  Intra-op Plan:   Post-operative Plan: Extubation in OR  Informed Consent: I have reviewed the patients History and Physical, chart, labs and discussed the procedure including the risks, benefits and alternatives for the proposed anesthesia with the patient or authorized representative who has indicated his/her understanding and acceptance.       Plan Discussed with: CRNA  Anesthesia Plan Comments:        Anesthesia Quick Evaluation

## 2020-05-28 ENCOUNTER — Ambulatory Visit (HOSPITAL_COMMUNITY)
Admission: RE | Admit: 2020-05-28 | Discharge: 2020-05-28 | Disposition: A | Discharge status: Home / Self Care | Payer: BC Managed Care – PPO | Source: Ambulatory Visit | Attending: Surgery | Admitting: Surgery

## 2020-05-28 ENCOUNTER — Ambulatory Visit (HOSPITAL_COMMUNITY): Payer: BC Managed Care – PPO | Admitting: Anesthesiology

## 2020-05-28 ENCOUNTER — Encounter (HOSPITAL_COMMUNITY): Payer: Self-pay | Admitting: Surgery

## 2020-05-28 ENCOUNTER — Encounter (HOSPITAL_COMMUNITY)
Admission: RE | Disposition: A | Discharge status: Home / Self Care | Payer: Self-pay | Source: Ambulatory Visit | Attending: Surgery

## 2020-05-28 DIAGNOSIS — K3532 Acute appendicitis with perforation and localized peritonitis, without abscess: Secondary | ICD-10-CM | POA: Diagnosis present

## 2020-05-28 DIAGNOSIS — K3533 Acute appendicitis with perforation and localized peritonitis, with abscess: Secondary | ICD-10-CM | POA: Insufficient documentation

## 2020-05-28 HISTORY — PX: LAPAROSCOPIC APPENDECTOMY: SHX408

## 2020-05-28 LAB — PREGNANCY, URINE: Preg Test, Ur: NEGATIVE

## 2020-05-28 SURGERY — APPENDECTOMY, LAPAROSCOPIC
Anesthesia: General | Site: Abdomen

## 2020-05-28 MED ORDER — LIDOCAINE 2% (20 MG/ML) 5 ML SYRINGE
INTRAMUSCULAR | Status: DC | PRN
  Administered 2020-05-28: 60 mg via INTRAVENOUS

## 2020-05-28 MED ORDER — LACTATED RINGERS IV SOLN: INTRAVENOUS | Status: DC

## 2020-05-28 MED ORDER — DEXAMETHASONE SODIUM PHOSPHATE 10 MG/ML IJ SOLN
INTRAMUSCULAR | Status: DC | PRN
  Administered 2020-05-28: 5 mg via INTRAVENOUS

## 2020-05-28 MED ORDER — 0.9 % SODIUM CHLORIDE (POUR BTL) OPTIME
TOPICAL | Status: DC | PRN
  Administered 2020-05-28: 1000 mL

## 2020-05-28 MED ORDER — TRAMADOL HCL 50 MG PO TABS: 50.0000 mg | ORAL_TABLET | Freq: Four times a day (QID) | ORAL | 0 refills | Status: AC | PRN

## 2020-05-28 MED ORDER — ACETAMINOPHEN 500 MG PO TABS
1000.0000 mg | ORAL_TABLET | ORAL | Status: AC
  Administered 2020-05-28: 1000 mg via ORAL
  Filled 2020-05-28: qty 2

## 2020-05-28 MED ORDER — AMISULPRIDE (ANTIEMETIC) 5 MG/2ML IV SOLN
10.0000 mg | Freq: Once | INTRAVENOUS | Status: AC
  Administered 2020-05-28: 10 mg via INTRAVENOUS

## 2020-05-28 MED ORDER — CHLORHEXIDINE GLUCONATE 4 % EX LIQD: 60.0000 mL | Freq: Once | CUTANEOUS | Status: DC

## 2020-05-28 MED ORDER — ACETAMINOPHEN 650 MG RE SUPP: 650.0000 mg | RECTAL | Status: DC | PRN

## 2020-05-28 MED ORDER — ROCURONIUM BROMIDE 10 MG/ML (PF) SYRINGE
PREFILLED_SYRINGE | INTRAVENOUS | Status: DC | PRN
  Administered 2020-05-28: 40 mg via INTRAVENOUS

## 2020-05-28 MED ORDER — SCOPOLAMINE 1 MG/3DAYS TD PT72
1.0000 | MEDICATED_PATCH | Freq: Once | TRANSDERMAL | Status: DC
  Administered 2020-05-28: 1.5 mg via TRANSDERMAL
  Filled 2020-05-28: qty 1

## 2020-05-28 MED ORDER — ACETAMINOPHEN 500 MG PO TABS: 1000.0000 mg | ORAL_TABLET | Freq: Once | ORAL | Status: DC

## 2020-05-28 MED ORDER — ONDANSETRON HCL 4 MG/2ML IJ SOLN
INTRAMUSCULAR | Status: AC
  Filled 2020-05-28: qty 2

## 2020-05-28 MED ORDER — SODIUM CHLORIDE 0.9% FLUSH: 3.0000 mL | Freq: Two times a day (BID) | INTRAVENOUS | Status: DC

## 2020-05-28 MED ORDER — OXYCODONE HCL 5 MG/5ML PO SOLN: 5.0000 mg | Freq: Once | ORAL | Status: DC | PRN

## 2020-05-28 MED ORDER — SUGAMMADEX SODIUM 200 MG/2ML IV SOLN
INTRAVENOUS | Status: DC | PRN
  Administered 2020-05-28: 125 mg via INTRAVENOUS

## 2020-05-28 MED ORDER — BUPIVACAINE-EPINEPHRINE (PF) 0.25% -1:200000 IJ SOLN
INTRAMUSCULAR | Status: AC
  Filled 2020-05-28: qty 30

## 2020-05-28 MED ORDER — CHLORHEXIDINE GLUCONATE 0.12 % MT SOLN
15.0000 mL | Freq: Once | OROMUCOSAL | Status: AC
  Administered 2020-05-28: 15 mL via OROMUCOSAL

## 2020-05-28 MED ORDER — FENTANYL CITRATE (PF) 100 MCG/2ML IJ SOLN
25.0000 ug | INTRAMUSCULAR | Status: DC | PRN
Start: 2020-05-28 — End: 2020-05-28

## 2020-05-28 MED ORDER — OXYCODONE HCL 5 MG PO TABS
5.0000 mg | ORAL_TABLET | ORAL | Status: DC | PRN
Start: 2020-05-28 — End: 2020-05-28

## 2020-05-28 MED ORDER — FENTANYL CITRATE (PF) 100 MCG/2ML IJ SOLN: 25.0000 ug | INTRAMUSCULAR | Status: DC | PRN

## 2020-05-28 MED ORDER — PROPOFOL 10 MG/ML IV BOLUS
INTRAVENOUS | Status: AC
  Filled 2020-05-28: qty 20

## 2020-05-28 MED ORDER — AMISULPRIDE (ANTIEMETIC) 5 MG/2ML IV SOLN
INTRAVENOUS | Status: AC
  Filled 2020-05-28: qty 4

## 2020-05-28 MED ORDER — DOCUSATE SODIUM 100 MG PO CAPS: 100.0000 mg | ORAL_CAPSULE | Freq: Two times a day (BID) | ORAL | 0 refills | Status: AC

## 2020-05-28 MED ORDER — CEFAZOLIN SODIUM-DEXTROSE 2-4 GM/100ML-% IV SOLN
2.0000 g | INTRAVENOUS | Status: AC
  Administered 2020-05-28: 2 g via INTRAVENOUS
  Filled 2020-05-28: qty 100

## 2020-05-28 MED ORDER — FENTANYL CITRATE (PF) 100 MCG/2ML IJ SOLN
INTRAMUSCULAR | Status: DC | PRN
  Administered 2020-05-28: 100 ug via INTRAVENOUS
  Administered 2020-05-28 (×3): 50 ug via INTRAVENOUS

## 2020-05-28 MED ORDER — LACTATED RINGERS IV BOLUS
500.0000 mL | Freq: Once | INTRAVENOUS | Status: AC
  Administered 2020-05-28: 500 mL via INTRAVENOUS

## 2020-05-28 MED ORDER — LACTATED RINGERS IR SOLN
Status: DC | PRN
  Administered 2020-05-28: 1000 mL

## 2020-05-28 MED ORDER — PROMETHAZINE HCL 25 MG/ML IJ SOLN
INTRAMUSCULAR | Status: AC
  Administered 2020-05-28: 6.25 mg via INTRAVENOUS
  Filled 2020-05-28: qty 1

## 2020-05-28 MED ORDER — PHENYLEPHRINE 40 MCG/ML (10ML) SYRINGE FOR IV PUSH (FOR BLOOD PRESSURE SUPPORT)
PREFILLED_SYRINGE | INTRAVENOUS | Status: DC | PRN
  Administered 2020-05-28: 120 ug via INTRAVENOUS

## 2020-05-28 MED ORDER — ONDANSETRON HCL 4 MG/2ML IJ SOLN
INTRAMUSCULAR | Status: DC | PRN
  Administered 2020-05-28: 4 mg via INTRAVENOUS

## 2020-05-28 MED ORDER — MIDAZOLAM HCL 2 MG/2ML IJ SOLN
INTRAMUSCULAR | Status: AC
  Filled 2020-05-28: qty 2

## 2020-05-28 MED ORDER — MIDAZOLAM HCL 5 MG/5ML IJ SOLN
INTRAMUSCULAR | Status: DC | PRN
  Administered 2020-05-28: 2 mg via INTRAVENOUS

## 2020-05-28 MED ORDER — ACETAMINOPHEN 325 MG PO TABS: 650.0000 mg | ORAL_TABLET | ORAL | Status: DC | PRN

## 2020-05-28 MED ORDER — FENTANYL CITRATE (PF) 250 MCG/5ML IJ SOLN
INTRAMUSCULAR | Status: AC
  Filled 2020-05-28: qty 5

## 2020-05-28 MED ORDER — OXYCODONE HCL 5 MG PO TABS
5.0000 mg | ORAL_TABLET | Freq: Once | ORAL | Status: DC | PRN
Start: 2020-05-28 — End: 2020-05-28

## 2020-05-28 MED ORDER — ORAL CARE MOUTH RINSE: 15.0000 mL | Freq: Once | OROMUCOSAL | Status: AC

## 2020-05-28 MED ORDER — BUPIVACAINE-EPINEPHRINE 0.25% -1:200000 IJ SOLN
INTRAMUSCULAR | Status: DC | PRN
  Administered 2020-05-28: 22 mL

## 2020-05-28 MED ORDER — SODIUM CHLORIDE 0.9% FLUSH: 3.0000 mL | INTRAVENOUS | Status: DC | PRN

## 2020-05-28 MED ORDER — PROMETHAZINE HCL 25 MG/ML IJ SOLN: 6.2500 mg | INTRAMUSCULAR | Status: DC | PRN

## 2020-05-28 MED ORDER — PROPOFOL 10 MG/ML IV BOLUS
INTRAVENOUS | Status: DC | PRN
  Administered 2020-05-28: 30 mg via INTRAVENOUS
  Administered 2020-05-28: 100 mg via INTRAVENOUS

## 2020-05-28 MED ORDER — SODIUM CHLORIDE 0.9 % IV SOLN: 250.0000 mL | INTRAVENOUS | Status: DC | PRN

## 2020-05-28 SURGICAL SUPPLY — 45 items
APPLIER CLIP 5 13 M/L LIGAMAX5 (MISCELLANEOUS)
APPLIER CLIP ROT 10 11.4 M/L (STAPLE)
CABLE HIGH FREQUENCY MONO STRZ (ELECTRODE) ×2 IMPLANT
CHLORAPREP W/TINT 26 (MISCELLANEOUS) ×2 IMPLANT
CLIP APPLIE 5 13 M/L LIGAMAX5 (MISCELLANEOUS) IMPLANT
CLIP APPLIE ROT 10 11.4 M/L (STAPLE) IMPLANT
COVER SURGICAL LIGHT HANDLE (MISCELLANEOUS) ×2 IMPLANT
COVER WAND RF STERILE (DRAPES) IMPLANT
CUTTER FLEX LINEAR 45M (STAPLE) ×2 IMPLANT
DECANTER SPIKE VIAL GLASS SM (MISCELLANEOUS) ×2 IMPLANT
DERMABOND ADVANCED (GAUZE/BANDAGES/DRESSINGS) ×1
DERMABOND ADVANCED .7 DNX12 (GAUZE/BANDAGES/DRESSINGS) ×1 IMPLANT
DRAIN CHANNEL 19F RND (DRAIN) IMPLANT
ELECT REM PT RETURN 15FT ADLT (MISCELLANEOUS) ×2 IMPLANT
ENDOLOOP SUT PDS II  0 18 (SUTURE)
ENDOLOOP SUT PDS II 0 18 (SUTURE) IMPLANT
EVACUATOR SILICONE 100CC (DRAIN) IMPLANT
GLOVE SURG ENC MOIS LTX SZ6 (GLOVE) ×2 IMPLANT
GLOVE SURG UNDER LTX SZ6.5 (GLOVE) ×2 IMPLANT
GOWN STRL REUS W/ TWL LRG LVL3 (GOWN DISPOSABLE) ×1 IMPLANT
GOWN STRL REUS W/TWL LRG LVL3 (GOWN DISPOSABLE) ×1
GOWN STRL REUS W/TWL XL LVL3 (GOWN DISPOSABLE) ×2 IMPLANT
GRASPER SUT TROCAR 14GX15 (MISCELLANEOUS) IMPLANT
IRRIG SUCT STRYKERFLOW 2 WTIP (MISCELLANEOUS) ×2
IRRIGATION SUCT STRKRFLW 2 WTP (MISCELLANEOUS) ×1 IMPLANT
KIT BASIN OR (CUSTOM PROCEDURE TRAY) ×2 IMPLANT
KIT TURNOVER KIT A (KITS) ×2 IMPLANT
NEEDLE INSUFFLATION 14GA 120MM (NEEDLE) ×2 IMPLANT
PENCIL SMOKE EVACUATOR (MISCELLANEOUS) IMPLANT
POUCH SPECIMEN RETRIEVAL 10MM (ENDOMECHANICALS) ×2 IMPLANT
RELOAD 45 VASCULAR/THIN (ENDOMECHANICALS) ×4 IMPLANT
RELOAD STAPLE TA45 3.5 REG BLU (ENDOMECHANICALS) IMPLANT
SCISSORS LAP 5X35 DISP (ENDOMECHANICALS) ×2 IMPLANT
SET TUBE SMOKE EVAC HIGH FLOW (TUBING) ×2 IMPLANT
SHEARS HARMONIC ACE PLUS 36CM (ENDOMECHANICALS) ×2 IMPLANT
SLEEVE XCEL OPT CAN 5 100 (ENDOMECHANICALS) ×2 IMPLANT
SUT ETHILON 2 0 PS N (SUTURE) IMPLANT
SUT MNCRL AB 4-0 PS2 18 (SUTURE) ×2 IMPLANT
TOWEL OR 17X26 10 PK STRL BLUE (TOWEL DISPOSABLE) ×2 IMPLANT
TOWEL OR NON WOVEN STRL DISP B (DISPOSABLE) ×2 IMPLANT
TRAY FOLEY MTR SLVR 14FR STAT (SET/KITS/TRAYS/PACK) ×2 IMPLANT
TRAY FOLEY MTR SLVR 16FR STAT (SET/KITS/TRAYS/PACK) IMPLANT
TRAY LAPAROSCOPIC (CUSTOM PROCEDURE TRAY) ×2 IMPLANT
TROCAR BLADELESS OPT 5 100 (ENDOMECHANICALS) ×2 IMPLANT
TROCAR XCEL 12X100 BLDLESS (ENDOMECHANICALS) ×2 IMPLANT

## 2020-05-28 NOTE — Transfer of Care (Signed)
Immediate Anesthesia Transfer of Care Note  Patient: Bobbijo Reh  Procedure(s) Performed: APPENDECTOMY LAPAROSCOPIC (N/A Abdomen)  Patient Location: PACU  Anesthesia Type:General  Level of Consciousness: sedated, patient cooperative and responds to stimulation  Airway & Oxygen Therapy: Patient Spontanous Breathing and Patient connected to face mask oxygen  Post-op Assessment: Report given to RN and Post -op Vital signs reviewed and stable  Post vital signs: Reviewed and stable  Last Vitals:  Vitals Value Taken Time  BP 118/62 05/28/20 1054  Temp 36.3 C 05/28/20 1054  Pulse 89 05/28/20 1056  Resp 15 05/28/20 1056  SpO2 100 % 05/28/20 1056  Vitals shown include unvalidated device data.  Last Pain:  Vitals:   05/28/20 0834  TempSrc: Oral  PainSc: 0-No pain      Patients Stated Pain Goal: 3 (05/28/20 0834)  Complications: No complications documented.

## 2020-05-28 NOTE — Anesthesia Postprocedure Evaluation (Signed)
Anesthesia Post Note  Patient: Natalie [name]  Procedure(s) Performed: APPENDECTOMY LAPAROSCOPIC (N/A Abdomen)     Patient location during evaluation: PACU Anesthesia Type: General Level of consciousness: awake and alert and oriented Pain management: pain level controlled Vital Signs Assessment: post-procedure vital signs reviewed and stable Respiratory status: spontaneous breathing, nonlabored ventilation and respiratory function stable Cardiovascular status: blood pressure returned to baseline Postop Assessment: no apparent nausea or vomiting Anesthetic complications: no   No complications documented.  Last Vitals:  Vitals:   05/28/20 1145 05/28/20 1315  BP: 108/67 (!) 110/54  Pulse: 70 (!) 102  Resp: 16 16  Temp: (!) 36.2 C (!) 36.3 C  SpO2: 97% 99%    Last Pain:  Vitals:   05/28/20 1315  TempSrc:   PainSc: 0-No pain                 Kaylyn Layer

## 2020-05-28 NOTE — Anesthesia Procedure Notes (Signed)
Procedure Name: Intubation Performed by: Anecia Nusbaum J, CRNA Pre-anesthesia Checklist: Patient identified, Emergency Drugs available, Suction available, Patient being monitored and Timeout performed Patient Re-evaluated:Patient Re-evaluated prior to induction Oxygen Delivery Method: Circle system utilized Preoxygenation: Pre-oxygenation with 100% oxygen Induction Type: IV induction Ventilation: Mask ventilation without difficulty Laryngoscope Size: Mac and 3 Grade View: Grade I Tube type: Oral Tube size: 7.0 mm Number of attempts: 1 Airway Equipment and Method: Stylet Placement Confirmation: ETT inserted through vocal cords under direct vision,  positive ETCO2 and breath sounds checked- equal and bilateral Secured at: 21 cm Tube secured with: Tape Dental Injury: Teeth and Oropharynx as per pre-operative assessment        

## 2020-05-28 NOTE — Discharge Instructions (Signed)
LAPAROSCOPIC SURGERY: POST OP INSTRUCTIONS   EAT Gradually transition to a high fiber diet with a fiber supplement over the next few weeks after discharge.  Start with a pureed / full liquid diet (see below)  WALK Walk an hour a day.  Control your pain to do that.    CONTROL PAIN Control pain so that you can walk, sleep, tolerate sneezing/coughing, go up/down stairs.  HAVE A BOWEL MOVEMENT DAILY Keep your bowels regular to avoid problems.  OK to try a laxative to override constipation.  OK to use an antidairrheal to slow down diarrhea.  Call if not better after 2 tries  CALL IF YOU HAVE PROBLEMS/CONCERNS Call if you are still struggling despite following these instructions. Call if you have concerns not answered by these instructions    1. DIET: Follow a light bland diet & liquids the first 24 hours after arrival home, such as soup, liquids, starches, etc.  Be sure to drink plenty of fluids.  Quickly advance to a usual solid diet within a few days.  Avoid fast food or heavy meals as your are more likely to get nauseated or have irregular bowels.  A low-sugar, high-fiber diet for the rest of your life is ideal.  2. Take your usually prescribed home medications unless otherwise directed.  3. PAIN CONTROL: a. Pain is best controlled by a usual combination of three different methods TOGETHER: i. Ice/Heat ii. Over the counter pain medication iii. Prescription pain medication b. Most patients will experience some swelling and bruising around the incisions.  Ice packs or heating pads (30-60 minutes up to 6 times a day) will help. Use ice for the first few days to help decrease swelling and bruising, then switch to heat to help relax tight/sore spots and speed recovery.  Some people prefer to use ice alone, heat alone, alternating between ice & heat.  Experiment to what works for you.  Swelling and bruising can take several weeks to resolve.   c. It is helpful to take an over-the-counter pain  medication regularly for the first few days: i. Naproxen (Aleve, etc)  Two 220mg tabs twice a day OR Ibuprofen (Advil, etc) Three 200mg tabs four times a day (every meal & bedtime) AND ii. Acetaminophen (Tylenol, etc) 500-650mg four times a day (every meal & bedtime) d. A  prescription for pain medication (such as oxycodone, hydrocodone, tramadol, gabapentin, methocarbamol, etc) should be given to you upon discharge.  Take your pain medication as prescribed, IF NEEDED.  i. If you are having problems/concerns with the prescription medicine (does not control pain, nausea, vomiting, rash, itching, etc), please call us [phone] to see if we need to switch you to a different pain medicine that will work better for you and/or control your side effect better. ii. If you need a refill on your pain medication, please give us 48 hour notice.  contact your pharmacy.  They will contact our office to request authorization. Prescriptions will not be filled after 5 pm or on week-ends  4. Avoid getting constipated.   a. Between the surgery and the pain medications, it is common to experience some constipation.   b. Increasing fluid intake and taking a fiber supplement (such as Metamucil, Citrucel, FiberCon, MiraLax, etc) 1-2 times a day regularly will usually help prevent this problem from occurring.   c. A mild laxative (prune juice, Milk of Magnesia, MiraLax, etc) should be taken according to package directions if there are no bowel movements after 48 hours.     5. Watch out for diarrhea.   a. If you have many loose bowel movements, simplify your diet to bland foods & liquids for a few days.   b. Stop any stool softeners and decrease your fiber supplement.   c. Switching to mild anti-diarrheal medications (Kayopectate, Pepto Bismol) can help.   d. If this worsens or does not improve, please call us.  6. Wash / shower every day.  You may shower over the skin glue which is waterproof  7. Glue will flake off  after about 2 weeks.  You may leave the incision open to air.  You may replace a dressing/Band-Aid to cover the incision for comfort if you wish.   8. ACTIVITIES as tolerated:   a. You may resume regular (light) daily activities beginning the next day--such as daily self-care, walking, climbing stairs--gradually increasing activities as tolerated.  If you can walk 30 minutes without difficulty, it is safe to try more intense activity such as jogging, treadmill, bicycling, low-impact aerobics, swimming, etc. b. Save the most intensive and strenuous activity for last such as sit-ups, heavy lifting, contact sports, etc  Refrain from any heavy lifting or straining until you are off narcotics for pain control.   c. DO NOT PUSH THROUGH PAIN.  Let pain be your guide: If it hurts to do something, don't do it.  Pain is your body warning you to avoid that activity for another week until the pain goes down. d. You may drive when you are no longer taking prescription pain medication, you can comfortably wear a seatbelt, and you can safely maneuver your car and apply brakes. e. You may have sexual intercourse when it is comfortable.  9. FOLLOW UP in our office a. Please call [facility] at [phone] to set up an appointment to see your surgeon in the office for a follow-up appointment approximately 2-3 weeks after your surgery. b. Make sure that you call for this appointment the day you arrive home to insure a convenient appointment time.  10. IF YOU HAVE DISABILITY OR FAMILY LEAVE FORMS, BRING THEM TO THE OFFICE FOR PROCESSING.  DO NOT GIVE THEM TO YOUR DOCTOR.   WHEN TO CALL US [phone]: 1. Poor pain control 2. Reactions / problems with new medications (rash/itching, nausea, etc)  3. Fever over 101.5 F (38.5 C) 4. Inability to urinate 5. Nausea and/or vomiting 6. Worsening swelling or bruising 7. Continued bleeding from incision. 8. Increased pain, redness, or drainage from the incision   The  clinic staff is available to answer your questions during regular business hours (8:30am-5pm).  Please don't hesitate to call and ask to speak to one of our nurses for clinical concerns.   If you have a medical emergency, go to the nearest emergency room or call 911.  A surgeon from [facility] is always on call at the hospitals   [facility] [address], [address], [place], [place]  [address] ? MAIN: [phone] ? TOLL FREE: [phone] ?  FAX [phone] [email or web address]   

## 2020-05-28 NOTE — Interval H&P Note (Signed)
History and Physical Interval Note:  05/28/2020 8:54 AM  Natalie [name]  has presented today for surgery, with the diagnosis of PERFORATED APPENDICITIS.  The various methods of treatment have been discussed with the patient and family. After consideration of risks, benefits and other options for treatment, the patient has consented to  Procedure(s) with comments: APPENDECTOMY LAPAROSCOPIC (N/A) - START TIME OF 9:45 AM FOR 90 MINUTES ROOM 2 as a surgical intervention.  The patient's history has been reviewed, patient examined, no change in status, stable for surgery.  I have reviewed the patient's chart and labs.  Questions were answered to the patient's satisfaction.     Mahreen Schewe [name]

## 2020-05-28 NOTE — Op Note (Signed)
Operative Report  Natalie [name] 35 y.o. female  [id number]  [id number]  05/28/2020  Surgeon: Berna Bue   Assistant: none  Procedure performed: Laparoscopic Appendectomy  Preop diagnosis: Perforated retrocecal appendicitis status post previous drain placement and subsequent removal, interval appendectomy  Post-op diagnosis/intraop findings: There was evidence of disruption of the appendix along the proximal third, normal-appearing appendiceal base, fibrotic scar tissue and intact tip which was densely adherent to the posterior wall of the cecum  Specimens: appendix with small wedge of additional cecum  EBL: minimal  Complications: none  Description of procedure: After obtaining informed consent the patient was brought to the operating room. Antibiotics were administered. SCD's were applied. General endotracheal anesthesia was initiated and a formal time-out was performed.  Foley catheter was inserted which was removed in the case. The abdomen was prepped and draped in the usual sterile fashion and the abdomen was entered using an infraumbilical Veress needle and insufflated to 15 mmHg. A 5 mm trocar and camera were then introduced, the abdomen was inspected and there is no evidence of injury from our entry. A suprapubic 5 mm trocar and a left lower quadrant 12 mm trocar were introduced under direct visualization following infiltration with local. The patient was then placed in Trendelenburg and rotated to the left and the small bowel was reflected cephalad. The appendix completely retroperitoneal and retrocecal. A combination of blunt dissection and harmonic scalpel were used to divide the lateral attachments of the cecum to medialized this and expose the appendix and free it of its retroperitoneal attachments. Great care was taken to ensure no injury to surrounding retroperitoneal structures, cecum or terminal ileum.  The ureter was easily visualized and was well away from our  field of work.  The base of the appendix was identified going into the cecum and there was noted to be an approximately 1.5 cm appendiceal remnant.  This was connected by a fibrotic scar tissue to a distal but seemingly completely transected half of the appendix, the tip of which was densely adherent to the posterior lateral cecal wall.  A window was created at the base of the appendix and a white load linear cutting stapler was used to transect the appendix from the cecum.  The appendiceal mesentery was divided using the harmonic scalpel, carefully dissecting the remaining fibrotic scar and appendiceal tissue from the adjacent cecum.  When we reached the tip of the appendix, there was thick adhesion between this and the lateral posterior wall of the cecum, rather than dissecting this in risking injury to the cecum, I did use another white load stapler to excise a small wedge of cecal wall with the specimen.  On completion the staple lines both appear hemostatic, viable and intact.  There is no remaining appendiceal tissue. Hemostasis was ensured.  The small bowel was run proximally several feet ensuring no other abnormalities.  The rectum and sigmoid colon were grossly normal although the sigmoid is extremely redundant.  The uterus and right ovary/fallopian tube appear normal.  The omentum was brought back down over the cecum and staple line. The appendix was placed in an Endo Catch bag and removed through our 12 mm trocar. The 90mm trocar site in the left lower quadrant was closed with a 0 vicryl in the fascia under direct visualization using a PMI device. The abdomen was desufflated and all trocars removed. The skin incisions were closed with running subcuticular monocryl and Dermabond. The patient was awakened, extubated and transported to the recovery room  in stable condition.   All counts were correct at the completion of the case.

## 2020-05-29 ENCOUNTER — Encounter (HOSPITAL_COMMUNITY): Payer: Self-pay | Admitting: Surgery

## 2020-05-29 LAB — SURGICAL PATHOLOGY

## 2021-02-14 ENCOUNTER — Ambulatory Visit (INDEPENDENT_AMBULATORY_CARE_PROVIDER_SITE_OTHER): Payer: BC Managed Care – PPO | Admitting: Student

## 2021-02-14 ENCOUNTER — Ambulatory Visit (HOSPITAL_COMMUNITY)
Admission: RE | Admit: 2021-02-14 | Discharge: 2021-02-14 | Disposition: A | Discharge status: Home / Self Care | Payer: BC Managed Care – PPO | Source: Ambulatory Visit | Attending: Family Medicine | Admitting: Family Medicine

## 2021-02-14 ENCOUNTER — Other Ambulatory Visit: Payer: Self-pay

## 2021-02-14 VITALS — BP 98/71 | HR 72 | Ht 61.0 in | Wt 105.2 lb

## 2021-02-14 DIAGNOSIS — M79605 Pain in left leg: Secondary | ICD-10-CM | POA: Insufficient documentation

## 2021-02-14 DIAGNOSIS — R0781 Pleurodynia: Secondary | ICD-10-CM | POA: Diagnosis not present

## 2021-02-14 DIAGNOSIS — R071 Chest pain on breathing: Secondary | ICD-10-CM | POA: Diagnosis present

## 2021-02-14 HISTORY — DX: Pain in left leg: M79.605

## 2021-02-14 HISTORY — DX: Pleurodynia: R07.81

## 2021-02-14 NOTE — Progress Notes (Signed)
Called [facility] as D dimer not resulted. Result 0.41. ULN 0.49. This is a normal (negative) result. PE unlikely. Attempted to call patient, left generic voicemail to call back on after hours line if she has continued concerns.   Terisa Starr, MD  Family Medicine Teaching Service

## 2021-02-14 NOTE — Assessment & Plan Note (Signed)
Left sided pleuritic CP. Normocardic on exam today pulse of 72 in office. Saturating 100% on RA no dyspnea. Given leg pain/vein engorgement doing r/o for DVT/PE with labs. -EKG was obtained which showed no acute ST changes and sinus bradycardia -return precautions discussed, will send to ED if D-dimer elevated

## 2021-02-14 NOTE — Patient Instructions (Addendum)
It was great to see you! Thank you for allowing me to participate in your care!   I recommend that you always bring your medications to each appointment as this makes it easy to ensure we are on the correct medications and helps [place] not miss when refills are needed.  Our plans for today:  - I got an EKG on you to check if there were any heart issues and it looked NORMAL - I also got a d-dimer test on you as well as some blood tests to make sure there is no blood clot or DVT present. If you start to have calf swelling larger than your other leg, warmth, shortness of breath and pain please return to care. I will call you with your d-dimer results and if it is positive I will direct you to your emergency room.   Take care and seek immediate care sooner if you develop any concerns. Please remember to show up 15 minutes before your scheduled appointment time!  Levin Erp, MD [facility]

## 2021-02-14 NOTE — Assessment & Plan Note (Signed)
Given patient history of leg pain and vein engorgement with some left sided pleuritic CP opted for DVT r/o. Well's score 2 with superficial vein enlargement and tenderness behind knee. No swelling/edema/erythema noted on exam. -STAT D-dimer collected, will send to ED if elevated for r/o -CBC, BMP -return precautions discussed

## 2021-02-14 NOTE — Progress Notes (Deleted)
    SUBJECTIVE:   CHIEF COMPLAINT / HPI:   Left leg pain: 35 year old female presenting for the above.  She states***.  PERTINENT  PMH / PSH: ***  OBJECTIVE:   There were no vitals taken for this visit. ***  General: NAD, pleasant, able to participate in exam Cardiac: RRR, no murmurs. Respiratory: CTAB, normal effort, No wheezes, rales or rhonchi Abdomen: Bowel sounds present, nontender, nondistended, no hepatosplenomegaly. Extremities: no edema or cyanosis. MSK:*** Neuro: alert, no obvious focal deficits Psych: Normal affect and mood  ASSESSMENT/PLAN:   No problem-specific Assessment & Plan notes found for this encounter.     Jackelyn Poling, DO [facility]    {    This will disappear when note is signed, click to select method of visit    :1}

## 2021-02-14 NOTE — Progress Notes (Signed)
    SUBJECTIVE:   CHIEF COMPLAINT / HPI: Left leg pain  Left Leg Pain Patient complained of left leg pain that has worsened since her appendectomy in March. She described her veins on her upper thighs as "turning a greenish color and enlarging sometimes". She also complained of pain on the back of her knee that is tender when pressing on it. She was worried about this and came to clinic today.  Left sided pleuritic chest pain Says for the past week her left side (near nipple line) hurts when taking deep breaths. She wanted to make sure this was not connected to her leg pain   ROS: Denies shortness of breath, palpitations, chest pain, abdominal pain, calf swelling   PERTINENT  PMH / PSH: Appendectomy for rupture appendectomy march 2022  OBJECTIVE:   BP 98/71   Pulse 72   Ht 5\' 1"  (1.549 m)   Wt 105 lb 3.2 oz (47.7 kg)   LMP 02/07/2021 (Approximate)   SpO2 100%   BMI 19.88 kg/m   General: Nontoxic, NAD, awake, alert, responsive to questions Head: Normocephalic atraumatic CV: Regular rate sinus rhythm no murmurs rubs or gallops Respiratory: Clear to ausculation bilaterally, no wheezes rales or crackles, chest rises symmetrically,  no increased work of breathing, speaking in full sentences Chest: Tenderness to palpation of left axillary line near breastline between ribs (denies any lesions on this side but visual exam was deferred) Abdomen: Soft, non-tender, non-distended, normoactive bowel sounds  Extremities: Moves upper and lower extremities freely, no edema in LE, calves equal size, no warmth, no erythema, varicosity of veins visualized behind left knee with tenderness to palpation, left thigh with superficial veins present. 2+ pulses in LE. See media below Neuro: No focal deficits Skin: No rashes or lesions visualized        ASSESSMENT/PLAN:   Pain of left lower extremity Given patient history of leg pain and vein engorgement with some left sided pleuritic CP opted for  DVT r/o. Well's score 2 with superficial vein enlargement and tenderness behind knee. No swelling/edema/erythema noted on exam. -STAT D-dimer collected, will send to ED if elevated for r/o -CBC, BMP -return precautions discussed  Pleuritic chest pain Left sided pleuritic CP. Normocardic on exam today pulse of 72 in office. Saturating 100% on RA no dyspnea. Given leg pain/vein engorgement doing r/o for DVT/PE with labs. -EKG was obtained which showed no acute ST changes and sinus bradycardia -return precautions discussed, will send to ED if D-dimer elevated     02/09/2021, MD [facility]

## 2021-02-15 LAB — CBC
Hematocrit: 36.2 % (ref 34.0–46.6)
Hemoglobin: 12 g/dL (ref 11.1–15.9)
MCH: 29.8 pg (ref 26.6–33.0)
MCHC: 33.1 g/dL (ref 31.5–35.7)
MCV: 90 fL (ref 79–97)
Platelets: 208 10*3/uL (ref 150–450)
RBC: 4.03 x10E6/uL (ref 3.77–5.28)
RDW: 11.8 % (ref 11.7–15.4)
WBC: 8.5 10*3/uL (ref 3.4–10.8)

## 2021-02-15 LAB — BASIC METABOLIC PANEL
BUN/Creatinine Ratio: 34 — ABNORMAL HIGH (ref 9–23)
BUN: 20 mg/dL (ref 6–20)
CO2: 23 mmol/L (ref 20–29)
Calcium: 10 mg/dL (ref 8.7–10.2)
Chloride: 103 mmol/L (ref 96–106)
Creatinine, Ser: 0.58 mg/dL (ref 0.57–1.00)
Glucose: 84 mg/dL (ref 70–99)
Potassium: 5 mmol/L (ref 3.5–5.2)
Sodium: 138 mmol/L (ref 134–144)
eGFR: 122 mL/min/{1.73_m2} (ref >59)

## 2021-02-15 LAB — D-DIMER, QUANTITATIVE: D-DIMER: 0.41 mg/L FEU (ref 0.00–0.49)

## 2021-02-16 ENCOUNTER — Encounter: Payer: Self-pay | Admitting: Student

## 2021-08-19 ENCOUNTER — Encounter: Payer: Self-pay | Admitting: *Deleted

## 2021-12-12 ENCOUNTER — Encounter (HOSPITAL_COMMUNITY): Payer: Self-pay

## 2021-12-12 ENCOUNTER — Ambulatory Visit (HOSPITAL_COMMUNITY)
Admission: EM | Admit: 2021-12-12 | Discharge: 2021-12-12 | Disposition: A | Discharge status: Home / Self Care | Payer: BC Managed Care – PPO | Attending: Internal Medicine | Admitting: Internal Medicine

## 2021-12-12 DIAGNOSIS — Z20822 Contact with and (suspected) exposure to covid-19: Secondary | ICD-10-CM | POA: Insufficient documentation

## 2021-12-12 DIAGNOSIS — J069 Acute upper respiratory infection, unspecified: Secondary | ICD-10-CM | POA: Insufficient documentation

## 2021-12-12 LAB — RESP PANEL BY RT-PCR (FLU A&B, COVID) ARPGX2
Influenza A by PCR: NEGATIVE
Influenza B by PCR: NEGATIVE
SARS Coronavirus 2 by RT PCR: NEGATIVE

## 2021-12-12 MED ORDER — BENZONATATE 100 MG PO CAPS: 100.0000 mg | ORAL_CAPSULE | Freq: Three times a day (TID) | ORAL | 0 refills | Status: DC

## 2021-12-12 MED ORDER — GUAIFENESIN ER 600 MG PO TB12: 1200.0000 mg | ORAL_TABLET | Freq: Two times a day (BID) | ORAL | 0 refills | Status: DC

## 2021-12-12 NOTE — ED Triage Notes (Signed)
Patient having chills, fever, and cough. Patient states her co-worker is positive for covid. Onset symptoms 2 days ago.

## 2021-12-12 NOTE — Discharge Instructions (Signed)
You have a viral upper respiratory infection.  COVID-19 testing and flu testing is pending.  I will call you if these results are positive.  Your work note was at the end of your packet.  Stay at home if your COVID-19 test is positive until day 6 of symptoms.  On day 6, you may return to work/the public but you must wear a mask until day 11 of symptoms to prevent spread of illness to others.  Take guaifenesin 1200mg   2 times daily to thin your mucous so that you can cough it up and blow it out of your nose easier. Drink plenty of water while taking this medication so that it works well in your body (at least 8 cups a day).   Take tessalon pearles every 8 hours as needed for cough.  You may take tylenol 1,000mg  and ibuprofen 600mg  every 6 hours with food as needed for fever/chills, sore throat, aches/pains, and inflammation associated with viral illness. Take this with food to avoid stomach upset.    You may do salt water and baking soda gargles every 4 hours as needed for your throat pain.  Please put 1 teaspoon of salt and 1/2 teaspoon of baking soda in 8 ounces of warm water then gargle and spit the water out. You may also put 1 tablespoon of honey in warm water and drink this to soothe your throat.  Place a humidifier in your room at night to help decrease dry air that can irritate your airway and cause you to have a sore throat and cough.  Please try to eat a well-balanced diet while you are sick so that your body gets proper nutrition to heal.  If you develop any new or worsening symptoms, please return.  If your symptoms are severe, please go to the emergency room.  Follow-up with your primary care provider for further evaluation and management of your symptoms as well as ongoing wellness visits.  I hope you feel better!

## 2021-12-12 NOTE — ED Provider Notes (Signed)
[facility]    CSN: [id number] Arrival date & time: 12/12/21  1139      History   Chief Complaint Chief Complaint  Patient presents with   Cough   Covid Exposure   Chills    HPI Natalie [name] is a 36 y.o. female.   Patient presents to urgent care for evaluation of sore throat, loss of taste and smell, nasal congestion, body aches, fever/chills, headache, cough, and fatigue that started 2 days ago on December 10, 2021. Cough is productive with clear sputum and worse at nighttime. Nasal congestion is thick and clear.  Sore throat is worsened with swallowing.  Headache yesterday was generalized and responded well to Advil.  No blurry vision, decreased visual acuity, or dizziness reported. Reports intermittent chills/sweating but unknown highest temp at home since she did not take her temperature. Denies shortness of breath, chest pain, nausea, vomiting, abdominal pain, diarrhea, and eye drainage.  Coworker tested positive for COVID-19 recently and patient was in close contact with them while she was at work. Denies history of asthma or chronic respiratory problems.  Patient is not a smoker and denies drug use. They are vaccinated against COVID-19 and they have not received their seasonal flu vaccine this year. Has attempted use of Advil prior to arrival at urgent care for relief of symptoms with relief.  Last dose of Advil was last night.   Cough   Past Medical History:  Diagnosis Date   Normal pregnancy, repeat 07/01/2011   S/P laparoscopy 02/20/2015   S/P laparoscopy 02/20/2015   SVD (spontaneous vaginal delivery) 07/02/2011   SVD (spontaneous vaginal delivery) 09/10/2016    Patient Active Problem List   Diagnosis Date Noted   Pleuritic chest pain 02/14/2021   Pain of left lower extremity 02/14/2021   Ruptured appendicitis 04/08/2020   Encounter for wellness examination in adult 11/15/2019   Epistaxis 04/15/2018   Weight loss 04/15/2018   Contraception management  05/26/2017   S/P laparoscopy 02/20/2015    Past Surgical History:  Procedure Laterality Date   HYSTEROSCOPY N/A 01/07/2015   Procedure: HYSTEROSCOPY DIAGNOSTIC, ATTEMPTED REMOVAL OF iNTRAUTERINE DEVICE;  Surgeon: Sherian Rein, MD;  Location: [facility];  Service: Gynecology;  Laterality: N/A;  MD requests 1hr OR time   IR RADIOLOGIST EVAL & MGMT  04/25/2020   IUD REMOVAL N/A 02/20/2015   Procedure: INTRAUTERINE DEVICE (IUD) REMOVAL;  Surgeon: Sherian Rein, MD;  Location: [facility];  Service: Gynecology;  Laterality: N/A;   LAPAROSCOPIC APPENDECTOMY N/A 05/28/2020   Procedure: APPENDECTOMY LAPAROSCOPIC;  Surgeon: Berna Bue, MD;  Location: [facility];  Service: General;  Laterality: N/A;  START TIME OF 9:45 AM FOR 90 MINUTES ROOM 2   LAPAROSCOPY N/A 02/20/2015   Procedure: LAPAROSCOPY OPERATIVE;  Surgeon: Sherian Rein, MD;  Location: [facility];  Service: Gynecology;  Laterality: N/A;   NO PAST SURGERIES      OB History     Gravida  3   Para  3   Term  3   Preterm  0   AB  0   Living  3      SAB  0   IAB  0   Ectopic  0   Multiple  0   Live Births  3            Home Medications    Prior to Admission medications   Medication Sig Start Date End Date Taking? Authorizing Provider  benzonatate (TESSALON) 100 MG capsule Take 1 capsule (100  mg total) by mouth every 8 (eight) hours. 12/12/21  Yes Carlisle Beers, FNP  guaiFENesin (MUCINEX) 600 MG 12 hr tablet Take 2 tablets (1,200 mg total) by mouth 2 (two) times daily. 12/12/21  Yes Carlisle Beers, FNP  acetaminophen (TYLENOL) 325 MG tablet Take 2 tablets (650 mg total) by mouth every 6 (six) hours as needed for mild pain. Patient not taking: Reported on 02/14/2021 04/16/20   Jacinto Halim, PA-C    Family History History reviewed. No pertinent family history.  Social History Social History   Tobacco Use   Smoking status: Never   Smokeless tobacco: Never  Vaping Use   Vaping Use:  Never used  Substance Use Topics   Alcohol use: No   Drug use: No     Allergies   Patient has no known allergies.   Review of Systems Review of Systems  Respiratory:  Positive for cough.   Per HPI   Physical Exam Triage Vital Signs ED Triage Vitals  Enc Vitals Group     BP 12/12/21 1245 101/60     Pulse Rate 12/12/21 1245 70     Resp --      Temp 12/12/21 1245 98.1 F (36.7 C)     Temp Source 12/12/21 1245 Oral     SpO2 12/12/21 1245 100 %     Weight --      Height --      Head Circumference --      Peak Flow --      Pain Score 12/12/21 1248 0     Pain Loc --      Pain Edu? --      Excl. in GC? --    No data found.  Updated Vital Signs BP 101/60 (BP Location: Left Arm)   Pulse 70   Temp 98.1 F (36.7 C) (Oral)   LMP 10/31/2021 (Exact Date)   SpO2 100%   Breastfeeding No   Visual Acuity Right Eye Distance:   Left Eye Distance:   Bilateral Distance:    Right Eye Near:   Left Eye Near:    Bilateral Near:     Physical Exam Vitals and nursing note reviewed.  Constitutional:      Appearance: Normal appearance. She is not ill-appearing or toxic-appearing.  HENT:     Head: Normocephalic and atraumatic.     Right Ear: Hearing, tympanic membrane, ear canal and external ear normal.     Left Ear: Hearing, tympanic membrane, ear canal and external ear normal.     Nose: Congestion present.     Mouth/Throat:     Lips: Pink.     Mouth: Mucous membranes are moist.     Pharynx: Posterior oropharyngeal erythema present.     Comments: Mild erythema to posterior oropharynx with small amount of clear postnasal drainage visualized. Airway intact and patent. Eyes:     General: Lids are normal. Vision grossly intact. Gaze aligned appropriately.        Right eye: No discharge.        Left eye: No discharge.     Extraocular Movements: Extraocular movements intact.     Conjunctiva/sclera: Conjunctivae normal.     Pupils: Pupils are equal, round, and reactive to light.   Cardiovascular:     Rate and Rhythm: Normal rate and regular rhythm.     Heart sounds: Normal heart sounds, S1 normal and S2 normal.  Pulmonary:     Effort: Pulmonary effort is normal. No respiratory distress.  Breath sounds: Normal breath sounds and air entry.  Musculoskeletal:     Cervical back: Neck supple.  Skin:    General: Skin is warm and dry.     Capillary Refill: Capillary refill takes less than 2 seconds.     Findings: No rash.  Neurological:     General: No focal deficit present.     Mental Status: She is alert and oriented to person, place, and time. Mental status is at baseline.     Cranial Nerves: No dysarthria or facial asymmetry.  Psychiatric:        Mood and Affect: Mood normal.        Speech: Speech normal.        Behavior: Behavior normal.        Thought Content: Thought content normal.        Judgment: Judgment normal.      UC Treatments / Results  Labs (all labs ordered are listed, but only abnormal results are displayed) Labs Reviewed  RESP PANEL BY RT-PCR (FLU A&B, COVID) [id number]    EKG   Radiology No results found.  Procedures Procedures (including critical care time)  Medications Ordered in UC Medications - No data to display  Initial Impression / Assessment and Plan / UC Course  I have reviewed the triage vital signs and the nursing notes.  Pertinent labs & imaging results that were available during my care of the patient were reviewed by me and considered in my medical decision making (see chart for details).   Viral URI with cough Symptoms and physical exam consistent with a viral upper respiratory tract infection that will likely resolve with rest, fluids, and prescriptions for symptomatic relief. No indication for imaging today based on stable cardiopulmonary exam and hemodynamically stable vital signs.  COVID-19 and flu testing is pending.  We will call patient if this is positive.  Quarantine guidelines discussed. Currently on day  2 of symptoms and does not qualify for antiviral therapy.   Guaifenesin and Tessalon sent to pharmacy for symptomatic relief to be taken as prescribed.   May use ibuprofen/tylenol over the counter for body aches, fever/chills, and overall discomfort associated with viral illness. Nonpharmacologic interventions for symptom relief provided and after visit summary below.   Strict ED/urgent care return precautions given.  Patient verbalizes understanding and agreement with plan.  Counseled patient regarding possible side effects and uses of all medications prescribed at today's visit.  Patient verbalizes understanding and agreement with plan.  All questions answered.  Patient discharged from urgent care in stable condition.        Final Clinical Impressions(s) / UC Diagnoses   Final diagnoses:  Viral URI with cough     Discharge Instructions      You have a viral upper respiratory infection.  COVID-19 testing and flu testing is pending.  I will call you if these results are positive.  Your work note was at the end of your packet.  Stay at home if your COVID-19 test is positive until day 6 of symptoms.  On day 6, you may return to work/the public but you must wear a mask until day 11 of symptoms to prevent spread of illness to others.  Take guaifenesin 1200mg   2 times daily to thin your mucous so that you can cough it up and blow it out of your nose easier. Drink plenty of water while taking this medication so that it works well in your body (at least 8 cups a day).  Take tessalon pearles every 8 hours as needed for cough.  You may take tylenol 1,000mg  and ibuprofen 600mg  every 6 hours with food as needed for fever/chills, sore throat, aches/pains, and inflammation associated with viral illness. Take this with food to avoid stomach upset.    You may do salt water and baking soda gargles every 4 hours as needed for your throat pain.  Please put 1 teaspoon of salt and 1/2 teaspoon of baking  soda in 8 ounces of warm water then gargle and spit the water out. You may also put 1 tablespoon of honey in warm water and drink this to soothe your throat.  Place a humidifier in your room at night to help decrease dry air that can irritate your airway and cause you to have a sore throat and cough.  Please try to eat a well-balanced diet while you are sick so that your body gets proper nutrition to heal.  If you develop any new or worsening symptoms, please return.  If your symptoms are severe, please go to the emergency room.  Follow-up with your primary care provider for further evaluation and management of your symptoms as well as ongoing wellness visits.  I hope you feel better!      ED Prescriptions     Medication Sig Dispense Auth. Provider   guaiFENesin (MUCINEX) 600 MG 12 hr tablet Take 2 tablets (1,200 mg total) by mouth 2 (two) times daily. 30 tablet Joella Prince M, FNP   benzonatate (TESSALON) 100 MG capsule Take 1 capsule (100 mg total) by mouth every 8 (eight) hours. 21 capsule Talbot Grumbling, FNP      PDMP not reviewed this encounter.   Talbot Grumbling, [place] 12/12/21 1353

## 2022-05-01 ENCOUNTER — Ambulatory Visit (INDEPENDENT_AMBULATORY_CARE_PROVIDER_SITE_OTHER): Payer: BC Managed Care – PPO | Admitting: Family Medicine

## 2022-05-01 VITALS — BP 102/68 | HR 72 | Wt 106.0 lb

## 2022-05-01 DIAGNOSIS — N3001 Acute cystitis with hematuria: Secondary | ICD-10-CM

## 2022-05-01 DIAGNOSIS — R300 Dysuria: Secondary | ICD-10-CM | POA: Diagnosis not present

## 2022-05-01 DIAGNOSIS — N898 Other specified noninflammatory disorders of vagina: Secondary | ICD-10-CM

## 2022-05-01 LAB — POCT WET PREP (WET MOUNT)
Clue Cells Wet Prep Whiff POC: NEGATIVE
Trichomonas Wet Prep HPF POC: ABSENT
WBC, Wet Prep HPF POC: >20

## 2022-05-01 LAB — POCT URINALYSIS DIP (MANUAL ENTRY)
Bilirubin, UA: NEGATIVE
Glucose, UA: NEGATIVE mg/dL
Ketones, POC UA: NEGATIVE mg/dL
Nitrite, UA: NEGATIVE
Spec Grav, UA: 1.02 (ref 1.010–1.025)
Urobilinogen, UA: 0.2 E.U./dL
pH, UA: 7 (ref 5.0–8.0)

## 2022-05-01 LAB — POCT UA - MICROSCOPIC ONLY

## 2022-05-01 MED ORDER — NITROFURANTOIN MONOHYD MACRO 100 MG PO CAPS: 100.0000 mg | ORAL_CAPSULE | Freq: Two times a day (BID) | ORAL | 0 refills | Status: AC

## 2022-05-01 NOTE — Progress Notes (Signed)
    SUBJECTIVE:   CHIEF COMPLAINT / HPI:   Vaginal and urinary complaints Ms. Howerton is a 37 year old woman who presents with urinary and vaginal complaints. She reports intercourse with her husband 5 days ago; they used a new condom type. She reports burning discomfort immediately upon insertion. The burning pain continued despite showering and washing with gentle soap, but eventually subsided. A day or two afterwards, she developed burning with urination, but near the end of the urinary stream.   Denies abnormal vaginal discharge, odor, pelvic discomfort, suprapubic pain, flank pain, fever, or chills.   PERTINENT  PMH / PSH:  Patient Active Problem List   Diagnosis Date Noted   Acute cystitis with hematuria 05/11/2022   Pleuritic chest pain 02/14/2021   Pain of left lower extremity 02/14/2021   Ruptured appendicitis 04/08/2020   Encounter for wellness examination in adult 11/15/2019   Epistaxis 04/15/2018   Weight loss 04/15/2018   Contraception management 05/26/2017   S/P laparoscopy 02/20/2015    OBJECTIVE:   BP 102/68   Pulse 72   Wt 106 lb (48.1 kg)   LMP 03/30/2022   SpO2 98%   BMI 20.03 kg/m    Physical Exam General: Awake, alert, oriented, no acute distress Respiratory: Normal work of breathing, no respiratory distress Neuro: Cranial nerves II through X grossly intact, able to move all extremities spontaneously Vulva: Normal appearing vulva without rashes, lesions, or deformities Vagina: Pale pink rugated vaginal tissue without obvious lesions, tenderness with speculum insertion, physiologic discharge of whitish color  Sensitive exam performed with chaperone in the room:  Erskine Emery, CMA  ASSESSMENT/PLAN:   Acute cystitis with hematuria UA and symptoms c/w UTI. Will treat with macrobid BID x 5 days. Urine sent for culture; will adjust antibiotics as indicated. Wet prep negative. Believe the vulvar discomfort immediately after intercourse to be local  reaction to lubricant used on new condoms. Discussed avoiding these new condoms and any other with similar ingredients.      Ezequiel Essex, MD [place]

## 2022-05-01 NOTE — Patient Instructions (Addendum)
It was wonderful to see you today. Thank you for allowing me to be a part of your care. Below is a short summary of what we discussed at your visit today:  Painful urination, vaginal complaint Today we tested your urine for infection, it showed a urine infection.  We also conducted a vaginal swab, but the results came back negative for BV, trichomonas, and yeast.  For the urine infection, take Macrobid twice daily for 5 days.  I have sent your urine off for culture.  If it grows a bacteria that is not treated with Macrobid, we will call you and switch the antibiotic.  Please return if your symptoms do not improve or if they get worse.  If you have any questions or concerns, please do not hesitate to contact us via phone or MyChart message.   Ezequiel Essex, MD

## 2022-05-05 LAB — URINE CULTURE

## 2022-05-11 DIAGNOSIS — N3001 Acute cystitis with hematuria: Secondary | ICD-10-CM

## 2022-05-11 HISTORY — DX: Acute cystitis with hematuria: N30.01

## 2022-05-11 NOTE — Assessment & Plan Note (Addendum)
UA and symptoms c/w UTI. Will treat with macrobid BID x 5 days. Urine sent for culture; will adjust antibiotics as indicated. Wet prep negative. Believe the vulvar discomfort immediately after intercourse to be local reaction to lubricant used on new condoms. Discussed avoiding these new condoms and any other with similar ingredients.

## 2022-07-07 ENCOUNTER — Encounter: Payer: Self-pay | Admitting: Family Medicine

## 2022-07-07 ENCOUNTER — Ambulatory Visit (INDEPENDENT_AMBULATORY_CARE_PROVIDER_SITE_OTHER): Payer: BC Managed Care – PPO | Admitting: Family Medicine

## 2022-07-07 VITALS — BP 91/59 | HR 95 | Wt 103.4 lb

## 2022-07-07 DIAGNOSIS — J029 Acute pharyngitis, unspecified: Secondary | ICD-10-CM | POA: Diagnosis not present

## 2022-07-07 DIAGNOSIS — B349 Viral infection, unspecified: Secondary | ICD-10-CM | POA: Insufficient documentation

## 2022-07-07 DIAGNOSIS — R059 Cough, unspecified: Secondary | ICD-10-CM

## 2022-07-07 LAB — POC SOFIA 2 FLU + SARS ANTIGEN FIA
Influenza A, POC: NEGATIVE
Influenza B, POC: NEGATIVE
SARS Coronavirus 2 Ag: NEGATIVE

## 2022-07-07 MED ORDER — BENZONATATE 100 MG PO CAPS: 200.0000 mg | ORAL_CAPSULE | Freq: Three times a day (TID) | ORAL | 0 refills | Status: DC | PRN

## 2022-07-07 MED ORDER — GUAIFENESIN ER 600 MG PO TB12: 1200.0000 mg | ORAL_TABLET | Freq: Two times a day (BID) | ORAL | 0 refills | Status: DC

## 2022-07-07 NOTE — Patient Instructions (Addendum)
It was so good to see you today! I am sorry that you are not feeling well.   This is most likely a viral infection. This will take time to get over. The treatment for this is supportive care. You can alternate Tylenol and Ibuprofen for pain or fever every 3 hours (there should be 6 hours in between each dose of Tylenol, and 6 hours in between doses of Ibuprofen). You can have a teaspoon of honey by itself or mixed with water to help the cough. Steam baths, Vicks vapor rub, a humidifier and nasal saline spray can help with congestion.   It is important to keep hydrated throughout this time!  Frequent hand washing to prevent recurrent illnesses is important.   Please return for recurrent symptoms that are not improving in 1-2 weeks, unable to keep fluids down, or any concerning symptoms to you.    Thank you for coming to your visit as scheduled. We have had a large "no-show" problem lately, and this significantly limits our ability to see and care for patients. As a friendly reminder- if you cannot make your appointment please call to cancel. We do have a no show policy for those who do not cancel within 24 hours. Our policy is that if you miss or fail to cancel an appointment within 24 hours, 3 times in a 51-month period, you may be dismissed from our clinic.   Thank you for choosing [facility].   Please call [phone] with any questions about today's appointment.  Please be sure to schedule follow up at the front  desk before you leave today.   Sabino Dick, DO PGY-3 Family Medicine

## 2022-07-07 NOTE — Progress Notes (Signed)
    SUBJECTIVE:   CHIEF COMPLAINT / HPI:   Natalie [name] is a 37 y.o. female who presents to the [facility] clinic today to discuss the following concerns:   Presents with malaise starting 2-3 days ago. She then developed a diffusely sore throat and pain with swallowing food or liquids but no difficulty swallowing. Her taste sensation and appetite are decreased. She had an episode of spinning dizziness for 5 minutes this morning with nausea. She also has minimal voice change, ear pain and fullness, cough and diffuse bilateral chest pain concurrent to the coughing which is worse at night, rhinorrhea, and fever yesterday, subjective today. No medications used. She denies vomiting, diarrhea, or hearing loss. The patient spent Saturday morning at a community meeting with 200 people. She is up to date on covid-19 vaccines, but did not get a flu vaccine in the fall.   OBJECTIVE:   BP (!) 91/59   Pulse 95   Wt 103 lb 6 oz (46.9 kg)   LMP 06/26/2022   SpO2 98%   BMI 19.53 kg/m    General: NAD, able to participate in exam ENT: Erythematous posterior pharynx. No tonsillar exudate or hypertrophy, no cobblestoning of posterior pharyngeal wall. No obvious voice muffling or drooling. TM's patent bilaterally without erythema, and EAC's appear normal. Cardiac: RRR, no murmurs. Respiratory: Cough present during examination. CTAB, normal effort, No wheezes, rales or rhonchi. Abdomen: Bowel sounds present, nontender, nondistended, no hepatosplenomegaly. Extremities: no edema or cyanosis. Neuro: alert, no obvious focal deficits Psych: Normal affect and mood  ASSESSMENT/PLAN:   Pharyngitis with viral syndrome Covid and flu negative. Treat symptomatically as noted in handout. Return precautions given.   Hypotension -Patient's blood pressure has ran low historically, slightly lower than her baseline today. Encouraged patient to increase fluid intake. If dizziness episodes recur outside of viral timeline,  will consider further evaluation.  Kayleen Memos, Medical Student [facility]   I was personally present and performed or re-performed the history, physical exam and medical decision making activities of this service and have verified that the service and findings are accurately documented in the student's note.  Sabino Dick, DO                  07/07/2022, 3:01 PM

## 2022-07-07 NOTE — Assessment & Plan Note (Addendum)
Covid and flu negative. Treat symptomatically as noted in handout. Return precautions given.

## 2022-08-19 IMAGING — CT CT ABD-PELV W/ CM
2 of 4 series · 14 of 46 positions shown, 16 images · IV contrast (iopamidol)
Comparison: CT abdomen and pelvis - 04/11/2020; 04/08/2020;

CT-guided percutaneous drainage catheter placement - 04/12/2020

CLINICAL DATA: History of ruptured appendicitis, post percutaneous
drainage catheter placement 04/12/2020 ([facility]).
TECHNIQUE: Multidetector CT imaging of the abdomen and pelvis was performed
using the standard protocol following bolus administration of
intravenous contrast.

CONTRAST:  100mL RLJUOL-511 IOPAMIDOL (RLJUOL-511) INJECTION 61%

[Series 2: abd pelvis 5.00 br40 s3 axial · axial · 0.71mm/px · z∈[+1209,+1559]mm · 11 of 78 slices shown, 13 images]
[im 4/78  soft-tissue]
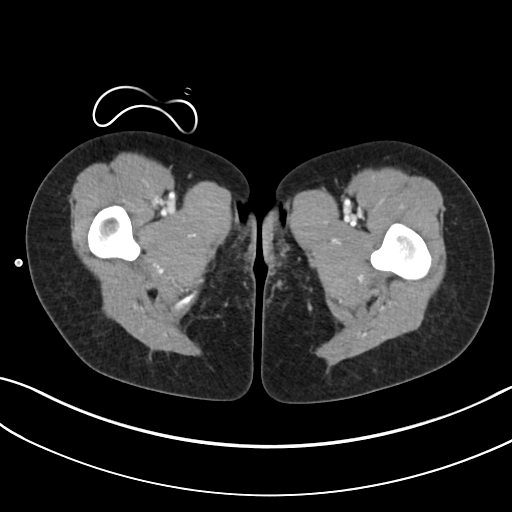
[im 4/78  bone]
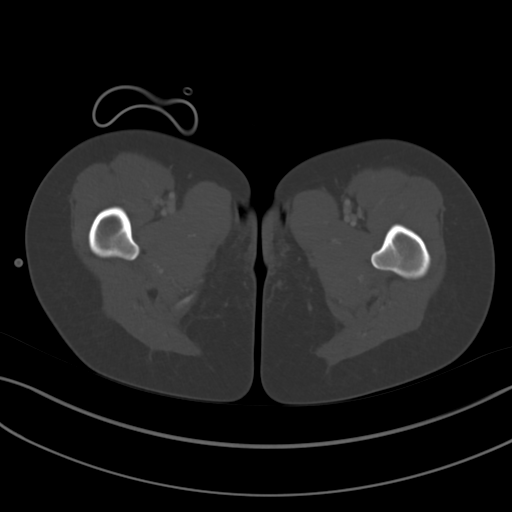
[im 12/78  soft-tissue]
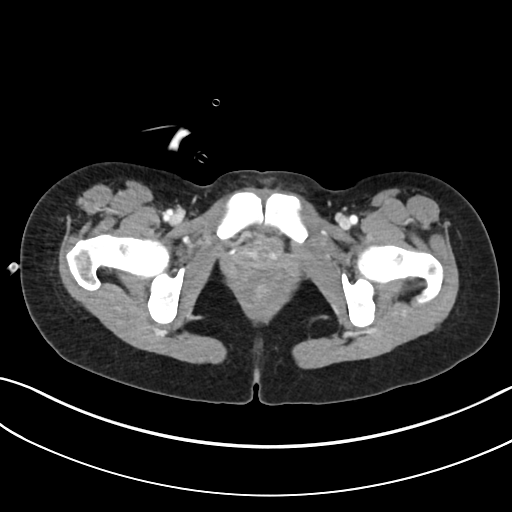
[im 20/78  soft-tissue]
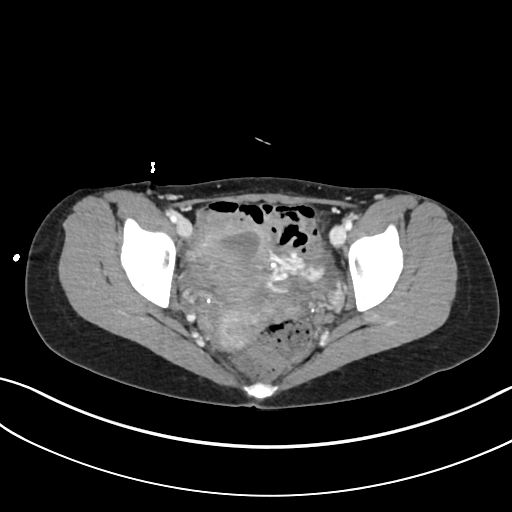
[im 27/78  soft-tissue]
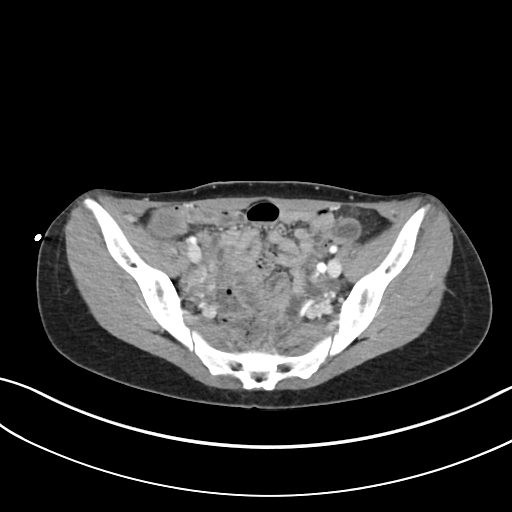
[im 31/78  soft-tissue]
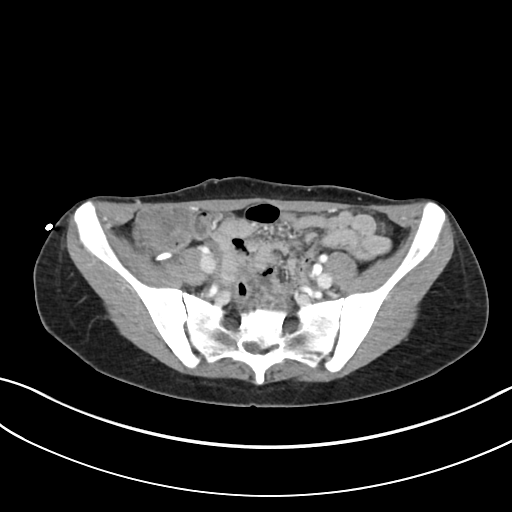
[im 39/78  soft-tissue]
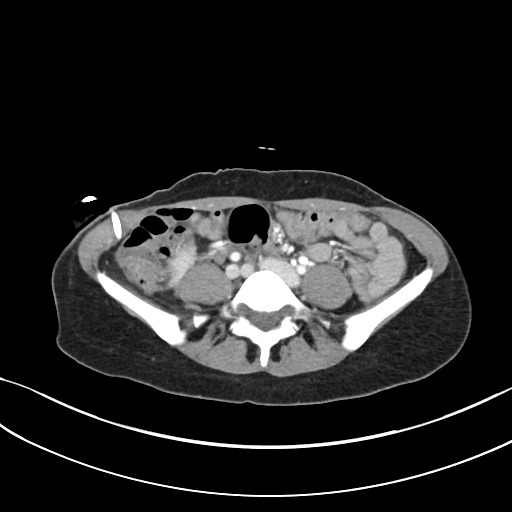
[im 47/78  soft-tissue]
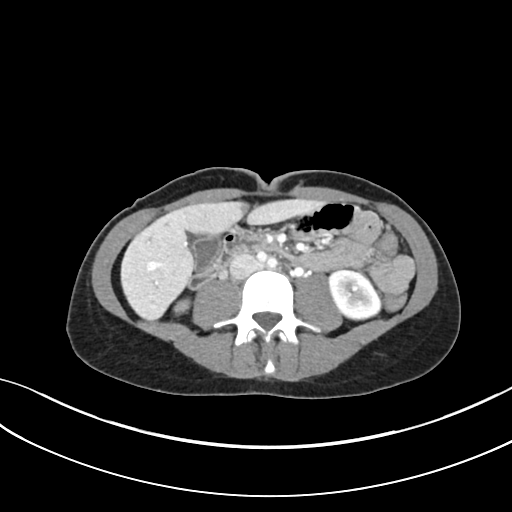
[im 51/78  soft-tissue]
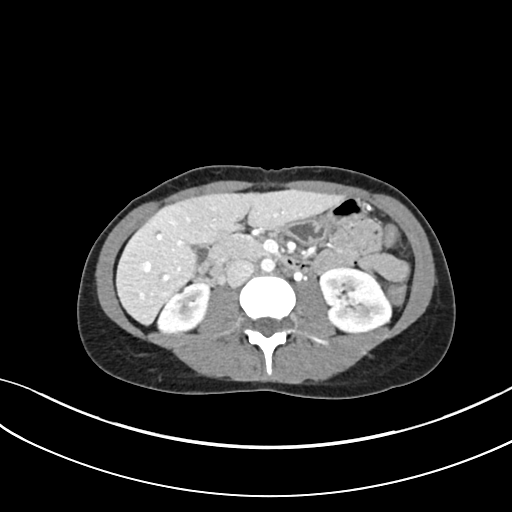
[im 58/78  soft-tissue]
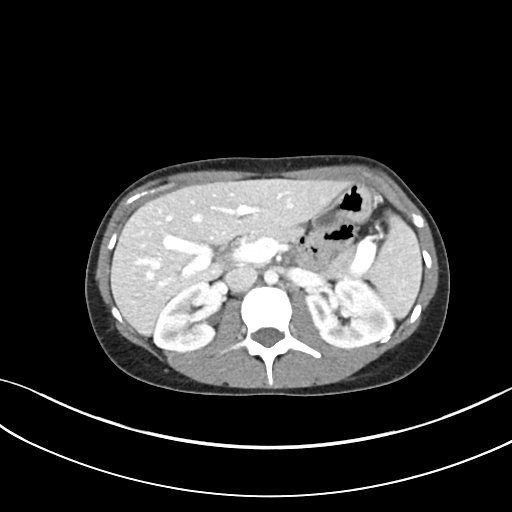
[im 58/78  bone]
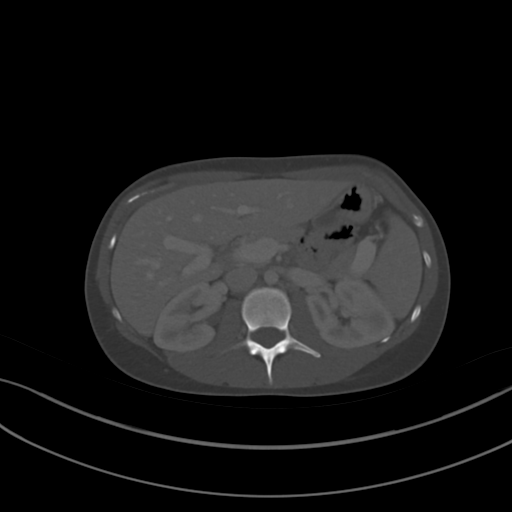
[im 66/78  soft-tissue]
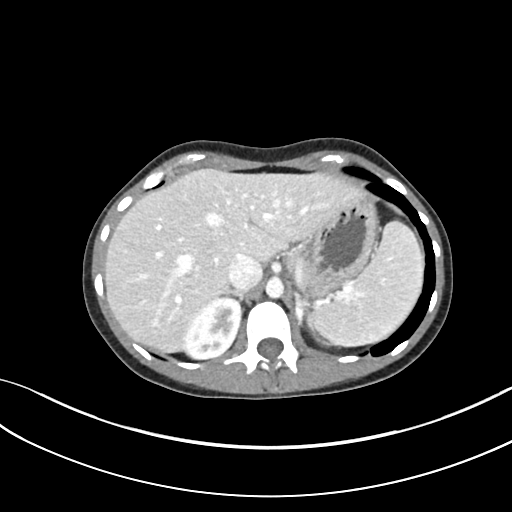
[im 74/78  soft-tissue]
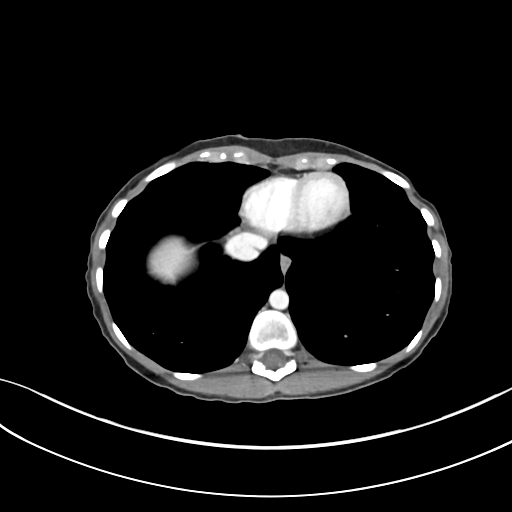

[Series 6: abd pelvis 2.00 br40 s3 cor · coronal · 0.71mm/px · 3 of 126 slices shown]
[im 42/126  soft-tissue]
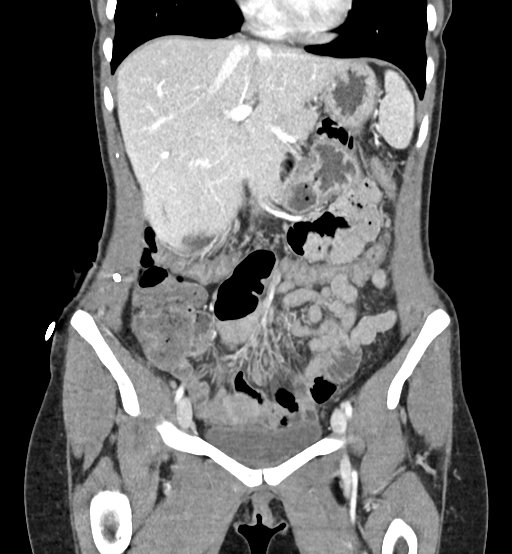
[im 56/126  soft-tissue]
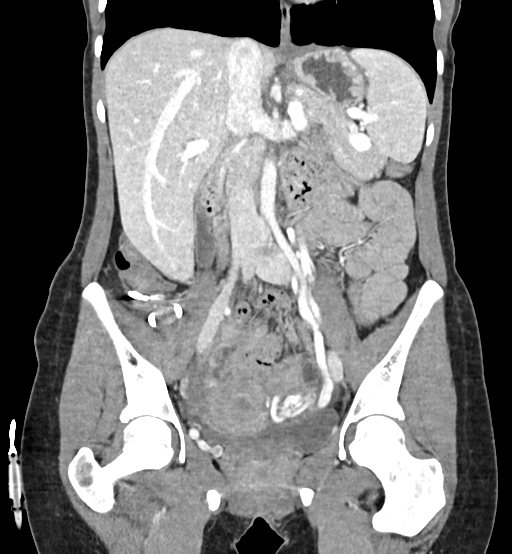
[im 70/126  soft-tissue]
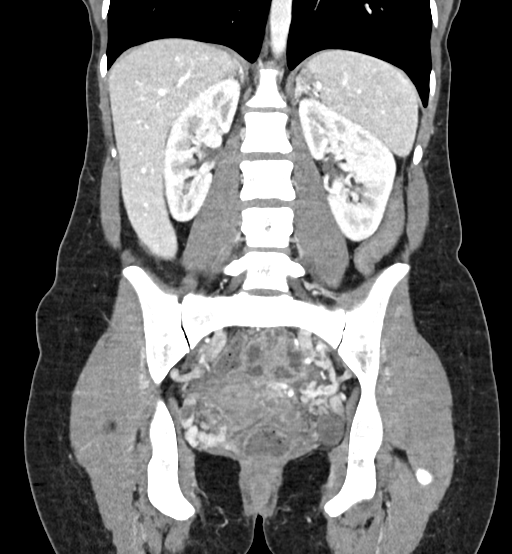

[14 of 46 positions shown; findings below may reference images not displayed]

Patient presents to the [REDACTED] for
drainage catheter evaluation and management

Patient is no longer flushing the percutaneous drainage catheter
however reports no output from the drainage catheter for the past
several days. She has completed her course of antibiotics. Besides
expected catheter related discomfort she has not had no worsening
abdominal pain. No fever or chills.

EXAM:
CT ABDOMEN AND PELVIS WITH CONTRAST
FINDINGS: Lower chest: Limited visualization of the lower thorax demonstrates
minimal bibasilar subsegmental atelectasis. No discrete focal
airspace opacities. No pleural effusion.

Normal heart size.  No pericardial effusion.

Hepatobiliary: Normal hepatic contour. No discrete hepatic lesions.
Normal appearance of the gallbladder given underdistention though
note is made of a small amount of pericholecystic fluid, presumably
reactive in etiology. No radiopaque gallstones. No intra
extrahepatic bili duct dilatation. No ascites.

Pancreas: Normal appearance of the pancreas.

Spleen: Appearance of the spleen.

Adrenals/Urinary Tract: There is symmetric enhancement of the
bilateral kidneys. No evidence of nephrolithiasis on this
postcontrast examination. No discrete renal lesions.

Normal appearance of the bilateral adrenal glands.

Normal appearance of the urinary bladder given degree of distention.

Stomach/Bowel: Stable positioning of percutaneous drainage catheter
with end coiled and locked within the posterior aspect of the right
pericolic gutter posterior to the cecum with resolution of
previously noted multiloculated pericecal abscess. No new
definable/drainable fluid collection within the abdomen or pelvis.
The residual appendix is not definitely identified.

The bowel is otherwise normal in course and caliber without discrete
area of wall thickening. Moderate colonic stool burden, particularly
within the rectal vault, without evidence of enteric obstruction. No
pneumoperitoneum, pneumatosis or portal venous gas.

Vascular/Lymphatic: Normal caliber of the abdominal aorta. The major
branch vessels of the abdominal aorta appear patent on this non CTA
examination.

There is reflux of contrast into a mildly hypertrophied left gonadal
vein which supplies several hypertrophied left-sided adnexal venous
collaterals.

Right-sided adnexal venous collaterals are also identified however
appear to be predominantly supplied via the right internal iliac
venous system.

Incidental note is made of nonocclusive thrombus within the
peripheral aspect of the right internal iliac vein (coronal images
59, 65 and 72, series 6). While new compared to previous
examinations, there is no perivascular stranding and thus may be
reactive in the setting of intra-abdominal/pelvic abscess.

No bulky retroperitoneal, mesenteric, pelvic or inguinal
lymphadenopathy.

Reproductive: Hypertrophied bilateral adnexal venous collaterals as
detailed above. No discrete adnexal lesion. Suspected 1.0 cm fibroid
involving the anterior aspect the lower uterine segment (sagittal
image 82, series 8). No free fluid the pelvic cul-de-sac.

Other: Regional soft tissues appear normal.

Musculoskeletal: No acute or aggressive osseous abnormalities.
IMPRESSION: 1. Stable positioning of percutaneous drainage catheter with
resolution of previously noted multiloculated pericecal abscess. No
definable/drainable fluid collection within the abdomen or pelvis.
2. Incidental note is made of nonocclusive thrombus within the
peripheral aspect of the right internal iliac vein, new compared to
previous examinations, though without associated perivascular
stranding and thus may be reactive in the setting of
intra-abdominal/pelvic abscess.
3. Reflux of contrast into a mildly hypertrophied left gonadal vein
which supplies several hypertrophied left-sided adnexal venous
collaterals nonspecific though could be seen in the setting of
pelvic venous congestion.
4. Small amount of pericholecystic fluid, presumably reactive in
etiology.

PLAN:
Patient subsequently underwent percutaneous drainage catheter
injection.

Above was discussed with Dr. Purity at the time of procedure
completion.

## 2022-12-04 ENCOUNTER — Other Ambulatory Visit: Payer: Self-pay

## 2022-12-04 ENCOUNTER — Emergency Department (HOSPITAL_COMMUNITY)
Admission: EM | Admit: 2022-12-04 | Discharge: 2022-12-05 | Disposition: A | Discharge status: Home / Self Care | Payer: BC Managed Care – PPO | Attending: Emergency Medicine | Admitting: Emergency Medicine

## 2022-12-04 ENCOUNTER — Encounter (HOSPITAL_COMMUNITY): Payer: Self-pay | Admitting: *Deleted

## 2022-12-04 DIAGNOSIS — R519 Headache, unspecified: Secondary | ICD-10-CM | POA: Diagnosis present

## 2022-12-04 DIAGNOSIS — K047 Periapical abscess without sinus: Secondary | ICD-10-CM | POA: Diagnosis not present

## 2022-12-04 NOTE — ED Triage Notes (Signed)
The pt has had a headache for 2-3 days   nausea and dizziness  lmp last month

## 2022-12-05 MED ORDER — AMOXICILLIN-POT CLAVULANATE 875-125 MG PO TABS: 1.0000 | ORAL_TABLET | Freq: Two times a day (BID) | ORAL | 0 refills | Status: DC

## 2022-12-05 NOTE — ED Provider Notes (Signed)
[place] EMERGENCY DEPARTMENT AT [facility] Provider Note   CSN: [id number] Arrival date & time: 12/04/22  2148     History  Chief Complaint  Patient presents with   Headache    Natalie [name] is a 37 y.o. female who presents with concern for sharp right-sided facial and head pain for the last couple of days.  Endorses chronic right upper tooth pain in this area, worse in the last few days.  Does endorse transient episode of nausea and lightheadedness when pain was most severe now resolved.  Pain does resolve with Tylenol and ibuprofen.  Has not follow-up with dentist.  No fevers or chills, no difficulty swallowing or sore throat, no change in phonation.  Husband at the bedside endorses that she is speaking normally for herself.  I reviewed her medical records.  No medications daily.  Patient denies any contraceptive, recent prolonged travel or immobilization, coagulopathy, or recent surgeries.  HPI     Home Medications Prior to Admission medications   Medication Sig Start Date End Date Taking? Authorizing Provider  amoxicillin-clavulanate (AUGMENTIN) 875-125 MG tablet Take 1 tablet by mouth every 12 (twelve) hours. 12/05/22  Yes Ikenna Ohms, Eugene Gavia, PA-C      Allergies    Patient has no known allergies.    Review of Systems   Review of Systems  Constitutional: Negative.   HENT:  Positive for dental problem.   Eyes: Negative.   Respiratory: Negative.    Cardiovascular: Negative.   Gastrointestinal:  Positive for nausea.  Neurological:  Positive for light-headedness and headaches.    Physical Exam Updated Vital Signs BP 103/63   Pulse 60   Temp (!) 97.5 F (36.4 C) (Oral)   Resp 17   Ht 5\' 1"  (1.549 m)   Wt 46.9 kg   LMP 11/30/2022   SpO2 100%   BMI 19.54 kg/m  Physical Exam Vitals and nursing note reviewed.  Constitutional:      Appearance: She is not ill-appearing or toxic-appearing.  HENT:     Head: Normocephalic and atraumatic.      Comments: No tenderness palpation over the left temporal area    Mouth/Throat:     Mouth: Mucous membranes are moist. No angioedema.     Pharynx: Uvula midline. No oropharyngeal exudate, posterior oropharyngeal erythema or uvula swelling.      Comments: No uvular deviation Eyes:     General:        Right eye: No discharge.        Left eye: No discharge.     Conjunctiva/sclera: Conjunctivae normal.  Neck:     Trachea: Trachea and phonation normal.     Meningeal: Brudzinski's sign and Kernig's sign absent.  Cardiovascular:     Rate and Rhythm: Normal rate and regular rhythm.     Pulses: Normal pulses.  Pulmonary:     Effort: Pulmonary effort is normal. No respiratory distress.     Breath sounds: Normal breath sounds. No wheezing or rales.  Abdominal:     General: Bowel sounds are normal. There is no distension.     Tenderness: There is no abdominal tenderness.  Musculoskeletal:        General: No deformity.     Cervical back: Neck supple.  Lymphadenopathy:     Cervical: Cervical adenopathy present.     Right cervical: Superficial cervical adenopathy present.     Left cervical: No superficial cervical adenopathy.  Skin:    General: Skin is warm and dry.  Capillary Refill: Capillary refill takes less than 2 seconds.  Neurological:     General: No focal deficit present.     Mental Status: She is alert and oriented to person, place, and time. Mental status is at baseline.  Psychiatric:        Mood and Affect: Mood normal.     ED Results / Procedures / Treatments   Labs (all labs ordered are listed, but only abnormal results are displayed) Labs Reviewed - No data to display  EKG None  Radiology No results found.  Procedures Procedures    Medications Ordered in ED Medications - No data to display  ED Course/ Medical Decision Making/ A&P                                 Medical Decision Making 37 year old female presents with right-sided facial pain, dental  problem.  Vital signs reassuring intake.  Cardiopulmonary exam unremarkable, dental exam as above with obvious dental decay and periapical infection of the right maxillary molar.  No oropharyngeal abscess, no sign of Ludwick's angina.  Risk Prescription drug management.   Clinical picture most consistent with radicular pain from dental infection.  Patient offered first dose of antibiotics in the ED, declined.  Outpatient antibiotic prescription sent to her preferred pharmacy.  Clinical concern for emergent underlying etiology of her symptoms that would warrant further ED workup or inpatient management is exceedingly low.  Dental resources provided.  Natalie [name] and her husband  voiced understanding of her medical evaluation and treatment plan. Each of their questions answered to their expressed satisfaction.  Return precautions were given.  Patient is well-appearing, stable, and was discharged in good condition.  This chart was dictated using voice recognition software, Dragon. Despite the best efforts of this provider to proofread and correct errors, errors may still occur which can change documentation meaning.         Final Clinical Impression(s) / ED Diagnoses Final diagnoses:  Dental infection    Rx / DC Orders ED Discharge Orders          Ordered    amoxicillin-clavulanate (AUGMENTIN) 875-125 MG tablet  Every 12 hours        12/05/22 0324              Jeremey Bascom, Eugene Gavia, PA-C 12/05/22 0542    Mesner, Barbara Cower, MD 12/05/22 [phone]

## 2022-12-05 NOTE — Discharge Instructions (Addendum)
Your headache is likely related to your dental infection. Please take entire course of antibiotics as directed.  Continue using ibuprofen / Tylenol, as well as prescribed Orajel for pain.  You will need to follow-up with your dentist for continued management of this. Please see dental resources below. Return to the emergency department for fevers, swelling or pain under the tongue or in the neck, difficulty breathing or swallowing, nausea or vomiting that does not stop, or any other new or concerning symptoms.

## 2022-12-07 ENCOUNTER — Encounter: Payer: Self-pay | Admitting: Student

## 2022-12-07 ENCOUNTER — Ambulatory Visit (INDEPENDENT_AMBULATORY_CARE_PROVIDER_SITE_OTHER): Payer: BC Managed Care – PPO | Admitting: Student

## 2022-12-07 VITALS — BP 90/50 | HR 69 | Temp 98.2°F | Ht 61.0 in | Wt 106.0 lb

## 2022-12-07 DIAGNOSIS — R590 Localized enlarged lymph nodes: Secondary | ICD-10-CM | POA: Diagnosis not present

## 2022-12-07 DIAGNOSIS — L739 Follicular disorder, unspecified: Secondary | ICD-10-CM | POA: Diagnosis not present

## 2022-12-07 NOTE — Progress Notes (Unsigned)
  SUBJECTIVE:   CHIEF COMPLAINT / HPI:   Patient presents today for ED follow-up after being seen on 12/04/2022 and diagnosed with dental infection.  She was given Augmentin.  She has been taking medication appropriately however notes that she has also had a tender bump on her right neck and is having small bumps, on her head which she originally believed to be bug bites.  She has a prominent swelling on her forehead as well that is tender.  Combination of all of these coming up at the same time concerned her.  Denies fever, shortness of breath, chills, night sweats, weight loss, difficulty swallowing.  PERTINENT  PMH / PSH: N/A  OBJECTIVE:  BP (!) 90/50   Pulse 69   Temp 98.2 F (36.8 C) (Oral)   Ht 5\' 1"  (1.549 m)   Wt 106 lb (48.1 kg)   LMP 12/05/2022   SpO2 98%   BMI 20.03 kg/m  General: Well-appearing, NAD HEENT: TMs normal, clear oropharynx with the exception of poor dentition CV: RRR, no murmurs auscultated Lymph: Prominent tender anterior cervical lymphadenopathy bilaterally Derm: 2 cm swollen erythematous indurated area on right forehead; several scattered scabbed lesions reflecting various healing stages on scalp consistent with folliculitis  ASSESSMENT/PLAN:   Assessment & Plan Cervical lymphadenopathy Suspect reactive to underlying dental infection.  Continue Augmentin.  Should this not resolve in 2 weeks, return for further assessment. Folliculitis Healing without treatment.  Indurated area on the forehead not concerning for cellulitis given no cobblestoning or edematous changes on point-of-care ultrasound performed with Dr. Linwood Dibbles.  Continue to monitor.   Return if symptoms worsen or fail to improve. Shelby Mattocks, DO 12/08/2022, 1:25 PM PGY-3, [facility]

## 2022-12-07 NOTE — Assessment & Plan Note (Signed)
Suspect reactive to underlying dental infection.  Continue Augmentin.  Should this not resolve in 2 weeks, return for further assessment.

## 2022-12-07 NOTE — Patient Instructions (Addendum)
It was great to see you today! Thank you for choosing [facility] for your primary care.  Today we addressed: Continue Augmentin, I expect your tender lymph nodes to improve with the antibiotic course.  Please continue to look for a dentist.  If the lymph nodes persist for 2 weeks, please return. You may use an over-the-counter hydrocortisone ointment if you have any itching for the bump on your forehead.  If you haven't already, sign up for My Chart to have easy access to your labs results, and communication with your primary care physician.  Return if symptoms worsen or fail to improve. Please arrive 15 minutes before your appointment to ensure smooth check in process.  We appreciate your efforts in making this happen.  Thank you for allowing me to participate in your care, Shelby Mattocks, DO 12/07/2022, 5:08 PM PGY-3, [facility]

## 2022-12-08 ENCOUNTER — Telehealth: Payer: Self-pay | Admitting: Family Medicine

## 2022-12-08 DIAGNOSIS — L739 Follicular disorder, unspecified: Secondary | ICD-10-CM | POA: Insufficient documentation

## 2022-12-08 HISTORY — DX: Follicular disorder, unspecified: L73.9

## 2022-12-08 NOTE — Telephone Encounter (Signed)
Patient came in asking if her doctor could please give her a call. She states that she misinterpreted some of the questions she was asked during her visit yesterday and wanted to give the doctor the correct information

## 2022-12-08 NOTE — Assessment & Plan Note (Addendum)
Healing without treatment.  Indurated area on the forehead not concerning for cellulitis given no cobblestoning or edematous changes on point-of-care ultrasound performed with Dr. Linwood Dibbles.  Continue to monitor.

## 2022-12-09 ENCOUNTER — Encounter: Payer: Self-pay | Admitting: Student

## 2022-12-11 ENCOUNTER — Ambulatory Visit (INDEPENDENT_AMBULATORY_CARE_PROVIDER_SITE_OTHER): Payer: BC Managed Care – PPO | Admitting: Student

## 2022-12-11 ENCOUNTER — Other Ambulatory Visit: Payer: Self-pay

## 2022-12-11 ENCOUNTER — Encounter: Payer: Self-pay | Admitting: Student

## 2022-12-11 ENCOUNTER — Ambulatory Visit
Admission: RE | Admit: 2022-12-11 | Discharge: 2022-12-11 | Disposition: A | Discharge status: Home / Self Care | Payer: BC Managed Care – PPO | Source: Ambulatory Visit | Attending: Family Medicine | Admitting: Family Medicine

## 2022-12-11 VITALS — BP 94/65 | HR 79 | Ht 62.0 in | Wt 105.6 lb

## 2022-12-11 DIAGNOSIS — R591 Generalized enlarged lymph nodes: Secondary | ICD-10-CM

## 2022-12-11 NOTE — Progress Notes (Signed)
    SUBJECTIVE:   CHIEF COMPLAINT / HPI:   Painful Lymphadenopathy Seen by Dr. Royal Piedra on 9/23 for painful dental infection that was initially treated in the ED on 9/21. She was being treated with Augmentin. She returns today because, while her dental pain is improving with augmentin, she has increasingly painful lymphadenopathy both of her cervical chain and also preauricularly. She is particularly concerned because she has a family history of "blood cancer" in her uncle and is having more systemic symptoms with headache, night sweats, dizziness.   OBJECTIVE:   BP 94/65   Pulse 79   Ht 5\' 2"  (1.575 m)   Wt 105 lb 9.6 oz (47.9 kg)   LMP 12/05/2022   SpO2 98%   BMI 19.31 kg/m   Gen: Anxious but non-toxic Oral: Generally poor dentition but I do not see any obvious abscess formation HENT: tender, mobile 7mm lymph node preauricularly. There are similarly sized nodes along the anterior cervical chain. There is no erythema or streaking to the overlying skin Cardio: RRR, without murmur Pulm: Normal WOB on RA  POCUS Demonstrates 0.73x0.49cm lymph node preauricularly with central vascularity, no increased capsular vascularity.  ASSESSMENT/PLAN:   Lymphadenopathy I am hopeful that these symptoms are indeed 2/2 her dental infection which is being addressed with Augmentin but will ultimately require dental care. Cannot, however, exclude rule out a primary lymphoma or other malignant process given her family history of blood cancer and systemic symptoms.  - She is aware to call the number on her Medicaid card to help get connected with a dentist. - CBC with diff, reflex to smear for abnormal CBC - HIV, RPR - Will obtain formal US of her lymph nodes    - CXR      J Dorothyann Gibbs, MD [facility]

## 2022-12-11 NOTE — Patient Instructions (Addendum)
You should call the number on the back of your Medicaid Card and ask them to help you identify a dentist in town who takes your insurance.  Also, please go to [facility] at [employer] to get your X-Ray done. They are open 7:30a-5p Monday-Friday. You do not need an appointment to get this done.  I have also ordered a soft tissue ultrasound to better characterize these tender lymph nodes. We will need to get insurance approval for this first and then [facility] will call you to schedule. This will happen at the same place as the chest X ray. Come back to see [place] no later than two weeks from now. I think if you start having fevers, it would be best for you to go back to the ER. Eliezer Mccoy, MD

## 2022-12-12 LAB — CBC WITH DIFFERENTIAL/PLATELET
Basophils Absolute: 0 10*3/uL (ref 0.0–0.2)
Basos: 1 %
EOS (ABSOLUTE): 0.1 10*3/uL (ref 0.0–0.4)
Eos: 1 %
Hematocrit: 37.4 % (ref 34.0–46.6)
Hemoglobin: 12.1 g/dL (ref 11.1–15.9)
Immature Grans (Abs): 0 10*3/uL (ref 0.0–0.1)
Immature Granulocytes: 0 %
Lymphocytes Absolute: 2.1 10*3/uL (ref 0.7–3.1)
Lymphs: 31 %
MCH: 30.2 pg (ref 26.6–33.0)
MCHC: 32.4 g/dL (ref 31.5–35.7)
MCV: 93 fL (ref 79–97)
Monocytes Absolute: 0.4 10*3/uL (ref 0.1–0.9)
Monocytes: 7 %
Neutrophils Absolute: 4.1 10*3/uL (ref 1.4–7.0)
Neutrophils: 60 %
Platelets: 175 10*3/uL (ref 150–450)
RBC: 4.01 x10E6/uL (ref 3.77–5.28)
RDW: 12.1 % (ref 11.7–15.4)
WBC: 6.7 10*3/uL (ref 3.4–10.8)

## 2022-12-12 LAB — HIV ANTIBODY (ROUTINE TESTING W REFLEX): HIV Screen 4th Generation wRfx: NONREACTIVE

## 2022-12-13 DIAGNOSIS — R591 Generalized enlarged lymph nodes: Secondary | ICD-10-CM

## 2022-12-13 HISTORY — DX: Generalized enlarged lymph nodes: R59.1

## 2022-12-13 NOTE — Assessment & Plan Note (Addendum)
I am hopeful that these symptoms are indeed 2/2 her dental infection which is being addressed with Augmentin but will ultimately require dental care. Cannot, however, exclude rule out a primary lymphoma or other malignant process given her family history of blood cancer and systemic symptoms.  - She is aware to call the number on her Medicaid card to help get connected with a dentist. - CBC with diff, reflex to smear for abnormal CBC - HIV, RPR - Will obtain formal US of her lymph nodes    - CXR

## 2022-12-15 ENCOUNTER — Ambulatory Visit (HOSPITAL_COMMUNITY)
Admission: RE | Admit: 2022-12-15 | Discharge: 2022-12-15 | Disposition: A | Discharge status: Home / Self Care | Payer: BC Managed Care – PPO | Source: Ambulatory Visit | Attending: Family Medicine | Admitting: Family Medicine

## 2022-12-15 DIAGNOSIS — R591 Generalized enlarged lymph nodes: Secondary | ICD-10-CM | POA: Diagnosis present

## 2022-12-24 ENCOUNTER — Ambulatory Visit: Payer: BC Managed Care – PPO | Admitting: Student

## 2022-12-24 ENCOUNTER — Other Ambulatory Visit: Payer: Self-pay

## 2022-12-24 ENCOUNTER — Encounter: Payer: Self-pay | Admitting: Student

## 2022-12-24 VITALS — BP 93/59 | HR 76 | Ht 62.0 in | Wt 106.6 lb

## 2022-12-24 DIAGNOSIS — R591 Generalized enlarged lymph nodes: Secondary | ICD-10-CM | POA: Diagnosis not present

## 2022-12-25 NOTE — Progress Notes (Signed)
    SUBJECTIVE:   CHIEF COMPLAINT / HPI:   Follow-up for painful lymphadenopathy Seen by me one week ago for painful R preauricular and submandibular lymphadenopathy. This was thought to be related to a dental infection that has now been treated with Augmentin. Has an upcoming dental appointment for definitive treatment  Imaging of the lymph nodes and CXR were both WNL. Labs obtained at last visit were likewise reassuring.  OBJECTIVE:   BP (!) 93/59   Pulse 76   Ht 5\' 2"  (1.575 m)   Wt 106 lb 9.6 oz (48.4 kg)   LMP 12/05/2022   SpO2 100%   BMI 19.50 kg/m   Gen: Well-appearing and NAD HENT: Preauricular and submandibular adenopathy have resolved Mouth: Poor dentition but no focal swelling or inflammation to suggest abscess Resp: Normal WOB on RA   ASSESSMENT/PLAN:   Lymphadenopathy Now resolved.  - Reinforced importance of keeping dental appt for definitive treatment and prevention of recurrence      J Dorothyann Gibbs, MD [facility]

## 2022-12-25 NOTE — Assessment & Plan Note (Signed)
Now resolved.  - Reinforced importance of keeping dental appt for definitive treatment and prevention of recurrence

## 2023-05-25 ENCOUNTER — Encounter: Payer: Self-pay | Admitting: Student

## 2023-05-25 ENCOUNTER — Ambulatory Visit (INDEPENDENT_AMBULATORY_CARE_PROVIDER_SITE_OTHER): Admitting: Student

## 2023-05-25 VITALS — BP 104/48 | HR 70 | Wt 103.0 lb

## 2023-05-25 DIAGNOSIS — Z30019 Encounter for initial prescription of contraceptives, unspecified: Secondary | ICD-10-CM

## 2023-05-25 LAB — POCT URINE PREGNANCY: Preg Test, Ur: NEGATIVE

## 2023-05-25 MED ORDER — DROSPIRENONE-ETHINYL ESTRADIOL 3-0.02 MG PO TABS
1.0000 | ORAL_TABLET | Freq: Every day | ORAL | 11 refills | Status: DC
Start: 2023-05-25 — End: 2023-11-01

## 2023-05-25 NOTE — Patient Instructions (Signed)
 Pleasure to meet you today.  Your pregnancy test was negative.  I have sent in prescription for Yaz which is a oral contraceptive.  Please take the medication as instructed on the package.

## 2023-05-25 NOTE — Assessment & Plan Note (Addendum)
 Discussed contraceptive options with patient today who expressed preference for oral contraceptive pills.  Denies any test today were negative. Over 3 seeks since last intercourse. -POCT Urine Pregnancy test obtained  -Started patient on Yaz -Discussed need to take medication daily -Pt aware medication doesn't protect against STI

## 2023-05-25 NOTE — Progress Notes (Signed)
    SUBJECTIVE:   CHIEF COMPLAINT / HPI:   38 year old female presenting today for unreceptive management.  She is sexually active..  Concerns have been regular every 28 days and lasting 3-5 days.  Used OCP mostly Sprintec's but will like to try another OCP due to dizziness on Sprintec's.  LMP was 05/06/2023.  Denies any vaginal bleeding, no history of blood clot, smoking, breast or endometrial cancer.  PERTINENT  PMH / PSH: Reviewed   OBJECTIVE:   BP (!) 104/48   Pulse 70   Wt 103 lb (46.7 kg)   SpO2 100%   BMI 18.84 kg/m    Physical Exam General: Alert, well appearing, NAD Cardiovascular: RRR, No Murmurs, Normal S2/S2 Respiratory: CTAB, No wheezing or Rales Abdomen: No distension or tenderness   ASSESSMENT/PLAN:   Contraception management Discussed contraceptive options with patient today who expressed preference for oral contraceptive pills.  Denies any test today were negative. Over 3 seeks since last intercourse. -POCT Urine Pregnancy test obtained  -Started patient on Yaz -Discussed need to take medication daily -Pt aware medication doesn't protect against STI     Jerre Simon, MD [facility]

## 2023-11-01 ENCOUNTER — Encounter (HOSPITAL_COMMUNITY): Payer: Self-pay

## 2023-11-01 ENCOUNTER — Observation Stay (HOSPITAL_COMMUNITY)
Admission: RE | Admit: 2023-11-01 | Discharge: 2023-11-04 | Disposition: A | Discharge status: Home / Self Care | Source: Ambulatory Visit | Attending: Family Medicine | Admitting: Family Medicine

## 2023-11-01 ENCOUNTER — Inpatient Hospital Stay (HOSPITAL_COMMUNITY)

## 2023-11-01 ENCOUNTER — Other Ambulatory Visit: Payer: Self-pay

## 2023-11-01 ENCOUNTER — Ambulatory Visit (INDEPENDENT_AMBULATORY_CARE_PROVIDER_SITE_OTHER): Admitting: Student

## 2023-11-01 VITALS — BP 92/73 | HR 100 | Temp 97.7°F | Wt 90.4 lb

## 2023-11-01 DIAGNOSIS — R1310 Dysphagia, unspecified: Secondary | ICD-10-CM | POA: Diagnosis not present

## 2023-11-01 DIAGNOSIS — R1033 Periumbilical pain: Secondary | ICD-10-CM | POA: Diagnosis present

## 2023-11-01 DIAGNOSIS — B9681 Helicobacter pylori [H. pylori] as the cause of diseases classified elsewhere: Secondary | ICD-10-CM | POA: Insufficient documentation

## 2023-11-01 DIAGNOSIS — R627 Adult failure to thrive: Secondary | ICD-10-CM

## 2023-11-01 DIAGNOSIS — K2950 Unspecified chronic gastritis without bleeding: Principal | ICD-10-CM | POA: Insufficient documentation

## 2023-11-01 DIAGNOSIS — E43 Unspecified severe protein-calorie malnutrition: Secondary | ICD-10-CM | POA: Diagnosis present

## 2023-11-01 DIAGNOSIS — R634 Abnormal weight loss: Secondary | ICD-10-CM | POA: Diagnosis not present

## 2023-11-01 DIAGNOSIS — R1084 Generalized abdominal pain: Secondary | ICD-10-CM

## 2023-11-01 DIAGNOSIS — Z681 Body mass index (BMI) 19 or less, adult: Secondary | ICD-10-CM | POA: Insufficient documentation

## 2023-11-01 HISTORY — DX: Dysphagia, unspecified: R13.10

## 2023-11-01 HISTORY — DX: Generalized abdominal pain: R10.84

## 2023-11-01 LAB — HEPATITIS PANEL, ACUTE
HCV Ab: NONREACTIVE
Hep A IgM: NONREACTIVE
Hep B C IgM: NONREACTIVE
Hepatitis B Surface Ag: NONREACTIVE

## 2023-11-01 LAB — HIV ANTIBODY (ROUTINE TESTING W REFLEX): HIV Screen 4th Generation wRfx: NONREACTIVE

## 2023-11-01 LAB — COMPREHENSIVE METABOLIC PANEL WITH GFR
ALT: 18 U/L (ref 0–44)
AST: 19 U/L (ref 15–41)
Albumin: 4.2 g/dL (ref 3.5–5.0)
Alkaline Phosphatase: 39 U/L (ref 38–126)
Anion gap: 12 (ref 5–15)
BUN: 9 mg/dL (ref 6–20)
CO2: 23 mmol/L (ref 22–32)
Calcium: 9.4 mg/dL (ref 8.9–10.3)
Chloride: 102 mmol/L (ref 98–111)
Creatinine, Ser: 0.68 mg/dL (ref 0.44–1.00)
GFR, Estimated: >60 mL/min (ref >60)
Glucose, Bld: 93 mg/dL (ref 70–99)
Potassium: 4 mmol/L (ref 3.5–5.1)
Sodium: 137 mmol/L (ref 135–145)
Total Bilirubin: 0.7 mg/dL (ref 0.0–1.2)
Total Protein: 8 g/dL (ref 6.5–8.1)

## 2023-11-01 LAB — CBC WITH DIFFERENTIAL/PLATELET
Abs Immature Granulocytes: 0.05 K/uL (ref 0.00–0.07)
Basophils Absolute: 0 K/uL (ref 0.0–0.1)
Basophils Relative: 0 %
Eosinophils Absolute: 0 K/uL (ref 0.0–0.5)
Eosinophils Relative: 0 %
HCT: 37.5 % (ref 36.0–46.0)
Hemoglobin: 12.6 g/dL (ref 12.0–15.0)
Immature Granulocytes: 1 %
Lymphocytes Relative: 28 %
Lymphs Abs: 2.8 K/uL (ref 0.7–4.0)
MCH: 29.9 pg (ref 26.0–34.0)
MCHC: 33.6 g/dL (ref 30.0–36.0)
MCV: 89.1 fL (ref 80.0–100.0)
Monocytes Absolute: 0.6 K/uL (ref 0.1–1.0)
Monocytes Relative: 6 %
Neutro Abs: 6.3 K/uL (ref 1.7–7.7)
Neutrophils Relative %: 65 %
Platelets: 234 K/uL (ref 150–400)
RBC: 4.21 MIL/uL (ref 3.87–5.11)
RDW: 12 % (ref 11.5–15.5)
WBC: 9.9 K/uL (ref 4.0–10.5)
nRBC: 0 % (ref 0.0–0.2)

## 2023-11-01 LAB — PREGNANCY, URINE: Preg Test, Ur: NEGATIVE

## 2023-11-01 LAB — LIPASE, BLOOD: Lipase: 34 U/L (ref 11–51)

## 2023-11-01 LAB — C-REACTIVE PROTEIN: CRP: <0.5 mg/dL (ref <1.0)

## 2023-11-01 LAB — SEDIMENTATION RATE: Sed Rate: 13 mm/h (ref 0–22)

## 2023-11-01 LAB — PHOSPHORUS: Phosphorus: 3.3 mg/dL (ref 2.5–4.6)

## 2023-11-01 LAB — VITAMIN B12: Vitamin B-12: 881 pg/mL (ref 180–914)

## 2023-11-01 LAB — FOLATE: Folate: 20.7 ng/mL (ref >5.9)

## 2023-11-01 LAB — TSH: TSH: 1.398 u[IU]/mL (ref 0.350–4.500)

## 2023-11-01 LAB — MAGNESIUM: Magnesium: 2.1 mg/dL (ref 1.7–2.4)

## 2023-11-01 MED ORDER — ADULT MULTIVITAMIN W/MINERALS CH
1.0000 | ORAL_TABLET | Freq: Every day | ORAL | Status: DC
  Administered 2023-11-01 – 2023-11-04 (×4): 1 via ORAL
  Filled 2023-11-01 (×4): qty 1

## 2023-11-01 MED ORDER — IOHEXOL 350 MG/ML SOLN
60.0000 mL | Freq: Once | INTRAVENOUS | Status: AC | PRN
  Administered 2023-11-01: 60 mL via INTRAVENOUS

## 2023-11-01 NOTE — TOC CM/SW Note (Signed)
 Transition of Care [facility]) - Inpatient Brief Assessment   Patient Details  Name: Natalie [name] MRN: [id number] Date of Birth: April 30, 1985  Transition of Care [facility]) CM/SW Contact:    Lauraine FORBES Saa, LCSWA Phone Number: 11/01/2023, 4:41 PM   Clinical Narrative:  4:41 PM Per chart review, patient resides at home with spouse and child(ren). Patient has a PCP and insurance. Patient does not have SNF/HH/DME history. Patient's preferred pharmacy is [facility] [phone] [facility]. No TOC needs were identified at this time. TOC will continue to follow and be available to assist.  Transition of Care Asessment: Insurance and Status: Insurance coverage has been reviewed Patient has primary care physician: Yes Home environment has been reviewed: Private Residence Prior level of function:: N/A Prior/Current Home Services: No current home services Social Drivers of Health Review: SDOH reviewed no interventions necessary Readmission risk has been reviewed: Yes (Currently Green 5%) Transition of care needs: no transition of care needs at this time

## 2023-11-01 NOTE — Assessment & Plan Note (Addendum)
 13% weight loss in 5 months. March 2025 103 lb >>90 lb today. - Admit to FMTS, med-surg, attending Dr. Donzetta - TSH, CBC, CMP, tTG-IgA - B12/folate

## 2023-11-01 NOTE — Patient Instructions (Signed)
 I am direct admitting you to the hospital. You should go to the main entrance and go to the admitting area to check in.   Thank you for allowing me to participate in your care, Dr. Damien Pinal [facility]

## 2023-11-01 NOTE — Assessment & Plan Note (Addendum)
-   Dietician consult, appreciate recs - EKG - Reg diet pending recs - Monitor for refeeding syndrome. - Mag, phosphate, and potassium. - PT/OT

## 2023-11-01 NOTE — H&P (Cosign Needed Addendum)
 Hospital Admission History and Physical Service Pager: [phone]  Patient name: Natalie [name] Medical record number: [id number] Date of Birth: May 04, 1985 Age: 38 y.o. Gender: female  Primary Care Provider: Tharon Lung, MD Consultants: GI Code Status: Full which was confirmed with patient Preferred Emergency Contact: Spouse [phone]  Chief Complaint: Unintentional weight loss and periumbilical pain  Assessment and Plan: Natalie [name] is a 38 y.o. female presenting with unintentional weight loss and periumbilical pain. Differential for presentation of this include GI pathology (r/o malignancy, IBD, celiac disease, PUD, pancreatitis), will also evaluate renal and liver function. Assessment & Plan Weight loss, unintentional 13% weight loss in 5 months. March 2025 103 lb >>90 lb today. - Admit to FMTS, med-surg, attending Dr. Donzetta - TSH, CBC, CMP, tTG-IgA - B12/folate Severe protein-calorie malnutrition (HCC) - Dietician consult, appreciate recs - EKG - Reg diet pending recs - Monitor for refeeding syndrome. - Mag, phosphate, and potassium. - PT/OT Generalized postprandial abdominal pain - Will obtain GI consult pending lab results - Possible EGD/colonoscopy - Lipase - Urine pregnancy - CT abd/pelvis - CRP/ESR Dysphagia No overt symptoms of aspiration. Some globus sensation with eating. Will not restrict diet at this time. - SLP  FEN/GI: Regular diet VTE Prophylaxis: Lovenox   Disposition: Med-surg   History of Present Illness:  Natalie [name] is a 38 y.o. female with PMHx/PSHx of appendectomy (2022) and IUD perforation and removal (2016) presenting with worsening unintentional weight loss and periumbilical pain onset 1 month ago. She is a direct admit from [facility] She was 103lbs 5 months ago and is currently 90lbs. She describes periumbilical pain as worsening, constant, exacerbated by eating and palpation, yesterday rated 5/10 and today rated 7/10,  non radiating. She has not had any changes in her diet, but has had decreased appetite and intake due to this postprandial pain. Associated symptoms include nausea, fatigue, occasional dysphagia, and decreased taste. She reports recent travel to [place] in July for 4 weeks where she was seen for the pain and was found to be anemic and was started on antibiotics. She reports no improvement after taking antibiotics. She also states having 5-7 loose bowel movements yesterday. Per chart review, patient was diagnosed with IBS but not formally worked up. Menstrual periods are regular. Patient denies hematochezia, hemoptysis, vomiting, night sweats, fever, family history of colon cancers/IBD, current pregnancy, bruising, hernia, rashes. She does not drink, smoke, or take illicit drugs. NKDA and not taking any medications currently.   Review Of Systems: Per HPI  Pertinent Past Medical History: None Remainder reviewed in history tab.   Pertinent Past Surgical History: Laparoscopic appendectomy Remainder reviewed in history tab.  Pertinent Social History: Tobacco use: Yes/No/Former Alcohol use: No Other Substance use: No Lives with husband  Pertinent Family History: None Remainder reviewed in history tab.   Important Outpatient Medications: None Remainder reviewed in medication history.   Objective: BP 110/74   Pulse 71   Temp 98.3 F (36.8 C) (Oral)   Wt 42 kg   SpO2 100%   BMI 16.94 kg/m  Exam: General: NAD, cachetic Eyes: EOMI ENTM: Atraumatic Neck: Supple Cardiovascular: RRR, no murmurs Respiratory: CTAB, breathing on room air comfortably Gastrointestinal: TTP in periumbilical region, no hepatosplenomegaly, negative Murphy's, negative Mcburney's point tenderness, soft, non-distended MSK: No deformities Derm: No rashes Neuro: AxOx4 Psych: Normal affect  Labs:  CBC BMET  No results for input(s): WBC, HGB, HCT, PLT in the last 168 hours. No results for input(s):  NA, K, CL, CO2,  BUN, CREATININE, GLUCOSE, CALCIUM in the last 168 hours.  Pertinent additional labs none   EKG: My own interpretation: NSR   Imaging Studies Performed: CT abd/pelvis: none yet    Escueta, Gwenn Bernida POUR, Medical Student 11/01/2023, 5:53 PM AI, [facility]  FPTS Intern pager: [phone], text pages welcome Secure chat group [facility] Teaching Service   Upper Level Addendum: I have seen and evaluated this patient along with Dr. Howell and reviewed the above note, making necessary revisions as appropriate. I agree with the medical decision making and physical exam as noted above. Gladis Howell, DO PGY-2 [facility] Family Medicine Residency

## 2023-11-01 NOTE — Assessment & Plan Note (Signed)
>>  ASSESSMENT AND PLAN FOR WEIGHT LOSS, UNINTENTIONAL  POSTPRANDIAL ABDOMINAL PAIN WRITTEN ON 11/01/2023  6:19 PM BY BRONSON, MARTIN, DO  13% weight loss in 5 months. March 2025 103 lb >>90 lb today. - Admit to FMTS, med-surg, attending Dr. Donzetta - TSH, CBC, CMP, tTG-IgA - B12/folate

## 2023-11-01 NOTE — Progress Notes (Signed)
    SUBJECTIVE:   CHIEF COMPLAINT / HPI:   Natalie [name] is a 38 y.o. female presenting for unintentional weight loss. Patient reports progressively decreased appetite for 1 month due to umbilical abdominal pain. First she was unable to eat dinner, then lunch then she was unable to eat at all beginning this week.   When she tries to force herself to eat she gets nauseated and has sharp non-radiating abdominal pain. She has not vomited. She continues to have small bowel movements, denies constipation, hematochezia. Reports some loose stool. Was previously diagnosed with IBS but has not formally been worked up by GI.   No family hx of cancers. History of appendectomy several years ago.   Historical workup:  Normal CBC w diff 11/2022 US  Soft Tissue Neck: benign lymph nodes  HIV neg 11/2022 TSH normal 03/2018  Due Cervical cancer screening.   PERTINENT  PMH / PSH: reviewed and updated.  OBJECTIVE:   BP 92/73   Pulse 100   Temp 97.7 F (36.5 C)   Wt 90 lb 6.4 oz (41 kg)   SpO2 96%   BMI 16.53 kg/m   Cachetic-appearing, no acute distress Cardio: Regular rate, regular rhythm, no murmurs on exam. Pulm: Clear, no wheezing, no crackles. No increased work of breathing Abdominal: bowel sounds present, soft, non-distended, tenderness in umbilical area  Extremities: no peripheral edema    ASSESSMENT/PLAN:   Assessment & Plan Failure to thrive in adult With 13 lb unintentional weight loss since 06/2022, cachetic appearance, and unable to tolerate food will direct admit to FMTS for GI work up. I am concerned for severe malnutrition and risk for refeeding syndrome.  Recommend RD consult, GI consult for EGD/Colonoscopy  Bed placement called and MedSurg bed available. Patient's vital signs are stable to self present to hospital. Dr. Donzetta evaluated in clinic and agreed with assessment and direct admission. She will be attending on tonight and will co-sign H&P.     Damien Pinal, DO [facility]

## 2023-11-01 NOTE — Hospital Course (Addendum)
 Natalie [name] is a 38 y.o.female with a history of appendectomy who was admitted to the [facility] Medicine Teaching Service at [facility] for unintentional weight loss and periumbilical pain. She is a direct admit from [facility]. Her hospital course is detailed below:  Weight loss, unintentional I Generalized postprandial abdominal pain Patient was 106lbs in October 2024 and presented 90lbs. She lost 15% in the past 5 months secondary to poor po intake due to postprandial pain. CT abd/pelvis showed no acute process. Lab workup was overall unremarkable. 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 Patient placed on quadruple H. Pylori therapy to be completed outpatient on a 10 day course. Patient improved and tolerated PO eating and consumed a variety of calories without dysphagia and with improved abdominal pain 1-2/10 that is no longer sharp.  Severe protein-calorie malnutrition Dietician consulted and recommended Ensure supplement TID, MVI and thiamine  daily, and continuing regular diet. Monitored for refeeding syndrome. Patient was evaluated by PT/OT and no acute needs were identified. SLP evaluated patient and reported functional oropharyngeal swallow, and recommended GERD precautions.  PCP Follow-up Recommendations: Non-immune to Hep B recommend vaccine H. Pylori eradication test after 4 weeks of therapy completion Ensure follow-up with GI

## 2023-11-01 NOTE — Assessment & Plan Note (Addendum)
 No overt symptoms of aspiration. Some globus sensation with eating. Will not restrict diet at this time. - SLP

## 2023-11-01 NOTE — Assessment & Plan Note (Addendum)
-   Will obtain GI consult pending lab results - Possible EGD/colonoscopy - Lipase - Urine pregnancy - CT abd/pelvis - CRP/ESR

## 2023-11-01 NOTE — Plan of Care (Signed)

## 2023-11-01 NOTE — Plan of Care (Signed)
 FMTS Brief Progress Note  S: Patient reports one month of decreased appetite, shortly followed by umbilical pain. Umbilical pain is constant regardless of eating. Food does not taste good. She notes difficulty with swallowing. Her job is as a Hospital doctor. No heavy metal exposure at work or home. She was able to eat dinner tonight without pain. Unclear what made this improvement.   O: BP (!) 85/65   Pulse 71   Temp 98.8 F (37.1 C) (Oral)   Wt 42 kg   SpO2 99%   BMI 16.94 kg/m    General: Patient is a thin but well appearing woman  CV: RRR Pulm: CTAB Abd: Soft, nondistended, no organomegaly, tenderness in the umbilicus, no guarding Extremities: No edema in legs or arms.   A/P: Unclear the source of her failure to thrive and abdominal pain.  Current workup without a clear cause: HIV, ESR, CRP, TSH, CBC, CMP, lipase, folate, Vit B12 all wnl - following labs: Urine pregnancy, hepatitis panel, IgA TTG - Orders reviewed. Labs for AM CMP, Mg, Phos ordered, which was adjusted as needed.  - If she has pain overnight, we can try a PPI.   Alena Morrison, Elio, MD 11/01/2023, 8:40 PM PGY-1, [facility] Family [facility] Night Resident  Please page [phone] with questions.

## 2023-11-02 DIAGNOSIS — R1033 Periumbilical pain: Secondary | ICD-10-CM

## 2023-11-02 DIAGNOSIS — K2950 Unspecified chronic gastritis without bleeding: Secondary | ICD-10-CM | POA: Diagnosis not present

## 2023-11-02 DIAGNOSIS — Z9049 Acquired absence of other specified parts of digestive tract: Secondary | ICD-10-CM | POA: Diagnosis not present

## 2023-11-02 DIAGNOSIS — R09A2 Foreign body sensation, throat: Secondary | ICD-10-CM

## 2023-11-02 DIAGNOSIS — R634 Abnormal weight loss: Secondary | ICD-10-CM | POA: Diagnosis not present

## 2023-11-02 LAB — COMPREHENSIVE METABOLIC PANEL WITH GFR
ALT: 16 U/L (ref 0–44)
AST: 15 U/L (ref 15–41)
Albumin: 3.7 g/dL (ref 3.5–5.0)
Alkaline Phosphatase: 34 U/L — ABNORMAL LOW (ref 38–126)
Anion gap: 5 (ref 5–15)
BUN: 12 mg/dL (ref 6–20)
CO2: 27 mmol/L (ref 22–32)
Calcium: 9.1 mg/dL (ref 8.9–10.3)
Chloride: 106 mmol/L (ref 98–111)
Creatinine, Ser: 0.75 mg/dL (ref 0.44–1.00)
GFR, Estimated: >60 mL/min (ref >60)
Glucose, Bld: 85 mg/dL (ref 70–99)
Potassium: 4 mmol/L (ref 3.5–5.1)
Sodium: 138 mmol/L (ref 135–145)
Total Bilirubin: 0.9 mg/dL (ref 0.0–1.2)
Total Protein: 7.1 g/dL (ref 6.5–8.1)

## 2023-11-02 LAB — MAGNESIUM: Magnesium: 2.2 mg/dL (ref 1.7–2.4)

## 2023-11-02 LAB — HEPATITIS C ANTIBODY: HCV Ab: NONREACTIVE

## 2023-11-02 LAB — HEPATITIS B SURFACE ANTIGEN: Hepatitis B Surface Ag: NONREACTIVE

## 2023-11-02 LAB — PHOSPHORUS: Phosphorus: 4.7 mg/dL — ABNORMAL HIGH (ref 2.5–4.6)

## 2023-11-02 LAB — TISSUE TRANSGLUTAMINASE, IGA: Tissue Transglutaminase Ab, IgA: <2 U/mL (ref 0–3)

## 2023-11-02 MED ORDER — THIAMINE MONONITRATE 100 MG PO TABS
100.0000 mg | ORAL_TABLET | Freq: Every day | ORAL | Status: DC
  Administered 2023-11-02 – 2023-11-04 (×3): 100 mg via ORAL
  Filled 2023-11-02 (×3): qty 1

## 2023-11-02 MED ORDER — ENSURE PLUS HIGH PROTEIN PO LIQD
237.0000 mL | Freq: Three times a day (TID) | ORAL | Status: DC
  Administered 2023-11-02 – 2023-11-04 (×5): 237 mL via ORAL

## 2023-11-02 MED ORDER — LACTATED RINGERS IV SOLN: INTRAVENOUS | Status: AC

## 2023-11-02 NOTE — Progress Notes (Signed)
 Physical Therapy Note  PT eval complete with full note to follow;  Natalie [name] is managing walking and ADLs well;  Independent walking the hallways, no PT needs identified;  OK for dc home from PT standpoint   Silvano Currier, PT  Acute Rehabilitation Services Pager [phone] Office [phone]

## 2023-11-02 NOTE — Assessment & Plan Note (Addendum)
 No overt symptoms of aspiration. Some globus sensation with eating. Will not restrict diet at this time. - SLP pending recs

## 2023-11-02 NOTE — Assessment & Plan Note (Deleted)
-   Mercury, copper, zinc, chromium, arsenic, lead

## 2023-11-02 NOTE — Evaluation (Signed)
 Occupational Therapy Evaluation Patient Details Name: Natalie [name] MRN: [id number] DOB: 26-Nov-1985 Today's Date: 11/02/2023   History of Present Illness   Natalie [name] is a 38 y.o. female adm 11/01/23 with PMHx/PSHx of appendectomy (2022) and IUD perforation and removal (2016), presented with worsening unintentional weight loss and periumbilical pain.     Clinical Impressions Patient admitted for the diagnosis above.  Presents at her baseline, needing no assist for ADL, in room mobility/toileting.  Continued abdominal pain is expressed, but subjectively better, per the patient.  No acute OT needs identified, and no post acute OT anticipated.  PT Eval is pending.  No DME recommendations.       If plan is discharge home, recommend the following:         Functional Status Assessment   Patient has not had a recent decline in their functional status     Equipment Recommendations   None recommended by OT     Recommendations for Other Services         Precautions/Restrictions   Precautions Precautions: None Restrictions Weight Bearing Restrictions Per Provider Order: No     Mobility Bed Mobility Overal bed mobility: Independent                  Transfers Overall transfer level: Independent                        Balance Overall balance assessment: No apparent balance deficits (not formally assessed)                                         ADL either performed or assessed with clinical judgement   ADL Overall ADL's : Independent                                             Vision Patient Visual Report: No change from baseline       Perception Perception: Not tested       Praxis Praxis: Not tested       Pertinent Vitals/Pain Pain Assessment Pain Assessment: Faces Faces Pain Scale: Hurts little more Pain Location: abdomen Pain Descriptors / Indicators: Jabbing Pain Intervention(s):  Monitored during session     Extremity/Trunk Assessment Upper Extremity Assessment Upper Extremity Assessment: Overall WFL for tasks assessed   Lower Extremity Assessment Lower Extremity Assessment: Defer to PT evaluation   Cervical / Trunk Assessment Cervical / Trunk Assessment: Normal   Communication Communication Communication: No apparent difficulties   Cognition Arousal: Alert Behavior During Therapy: WFL for tasks assessed/performed Cognition: No apparent impairments                               Following commands: Intact       Cueing  General Comments   Cueing Techniques: Verbal cues   VSS   Exercises     Shoulder Instructions      Home Living Family/patient expects to be discharged to:: Private residence Living Arrangements: Spouse/significant other;Children Available Help at Discharge: Family;Available PRN/intermittently Type of Home: House Home Access: Level entry     Home Layout: One level     Bathroom Shower/Tub: Tub/shower unit;Walk-in shower   Bathroom Toilet: Standard  Home Equipment: None          Prior Functioning/Environment Prior Level of Function : Independent/Modified Independent;Working/employed;Driving                    OT Problem List: Pain   OT Treatment/Interventions:        OT Goals(Current goals can be found in the care plan section)   Acute Rehab OT Goals Patient Stated Goal: Return home OT Goal Formulation: With patient Time For Goal Achievement: 11/09/23 Potential to Achieve Goals: Good   OT Frequency:       Co-evaluation              AM-PAC OT 6 Clicks Daily Activity     Outcome Measure Help from another person eating meals?: None Help from another person taking care of personal grooming?: None Help from another person toileting, which includes using toliet, bedpan, or urinal?: None Help from another person bathing (including washing, rinsing, drying)?: None Help from  another person to put on and taking off regular upper body clothing?: None Help from another person to put on and taking off regular lower body clothing?: None 6 Click Score: 24   End of Session Nurse Communication: Mobility status  Activity Tolerance: Patient tolerated treatment well Patient left: in chair;with call bell/phone within reach  OT Visit Diagnosis: Pain                Time: 1020-1040 OT Time Calculation (min): 20 min Charges:  OT General Charges $OT Visit: 1 Visit OT Evaluation $OT Eval Moderate Complexity: 1 Mod  11/02/2023  RP, OTR/L  Acute Rehabilitation Services  Office:  [phone]   Natalie [name] 11/02/2023, 10:44 AM

## 2023-11-02 NOTE — Progress Notes (Addendum)
 Daily Progress Note Intern Pager: [phone]  Patient name: Natalie [name] Medical record number: [id number] Date of birth: [birth year] Age: 38 y.o. Gender: female  Primary Care Provider: Tharon Lung, MD Consultants: GI Code Status: Full  Pt Overview and Major Events to Date:  Admitted 8/18  Assessment and Plan: Natalie [name] is a 38 y.o. female with pmhx/pshx appendectomy, IUD perforation and removal, presenting with unintentional weight loss and postprandial periumbilical pain.  Workup including CT abdomen pelvis so far unremarkable.  Patient is still having severe pain after eating and is unable to tolerate p.o. intake due to pain. Assessment & Plan Weight loss, unintentional  Postprandial abdominal pain Labs and imaging workup so far unremarkable. 13% weight loss in 5 months. March 2025 103 lb >>90 lb today. Decreased intake due to constant postprandial pain.  - tTG-IgA pending - Will obtain GI consult, possible EGD/colonoscopy - LR 40ml/h IVF - AM CBC, BMP Severe protein-calorie malnutrition (HCC) - Dietician consult, reg diet pending recs - Monitor for refeeding syndrome: phos, mag, potassium - PT/OT pending recs Dysphagia No overt symptoms of aspiration. Some globus sensation with eating. Will not restrict diet at this time. - SLP pending recs  FEN/GI: Regular diet PPx: Lovenox  Dispo:Home pending clinical improvement . Barriers include refeeding syndrome  Subjective:  Patient reports being able to eat and drink last night without postprandial pain. She states the periumbilical pain is now 2/10. However, this morning she ate again and had postprandrial pain and later vommited. Patient describes pain as located in umbilical region and sharp. She admits that she was highly stressed at the onset of this abdominal pain 1 month ago due to her daughter graduating and moving. Patient admits to fatigue, occasional globus sensation when swallowing. Patient denies fever,  diarrhea, altered taste.  Objective: Temp:  [97.7 F (36.5 C)-98.8 F (37.1 C)] 98.1 F (36.7 C) (08/19 0521) Pulse Rate:  [58-100] 58 (08/19 0521) BP: (85-110)/(65-74) 85/66 (08/19 0521) SpO2:  [96 %-100 %] 98 % (08/19 0521) Weight:  [41 kg-42 kg] 42 kg (08/18 1619) Physical Exam: General: NAD, thin but well appearing Cardiovascular: RRR Respiratory: CTAB, breathing comfortably on room air Abdomen: TTP in umbilical region, surgical scar in umbilical region, non-distended, no rashes Extremities: No deformities  Laboratory: Most recent CBC Lab Results  Component Value Date   WBC 9.9 11/01/2023   HGB 12.6 11/01/2023   HCT 37.5 11/01/2023   MCV 89.1 11/01/2023   PLT 234 11/01/2023   Most recent BMP    Latest Ref Rng & Units 11/02/2023    5:39 AM  BMP  Glucose 70 - 99 mg/dL 85   BUN 6 - 20 mg/dL 12   Creatinine 9.55 - 1.00 mg/dL 9.24   Sodium 864 - 854 mmol/L 138   Potassium 3.5 - 5.1 mmol/L 4.0   Chloride 98 - 111 mmol/L 106   CO2 22 - 32 mmol/L 27   Calcium 8.9 - 10.3 mg/dL 9.1    Other pertinent labs Gluc 93>85 Phos 3.3>4.7 Mag 2.1>2.2 Potassium 4.0>4.0  Lipase wnl CRP/ESR wnl TSH wnl B12/folate wnl Cr wnl Liver panel wnl Viral liver neg HIV neg Urine preg neg   Imaging/Diagnostic Tests: CT abd/pelv IMPRESSION: No acute findings in the abdomen or pelvis.  Wilburt Gwenn Bernida MARLA, Medical Student 11/02/2023, 7:11 AM  AI, [facility] FPTS Intern pager: [phone], text pages welcome Secure chat group [facility] Teaching Service   I was personally present and performed or  re-performed the history, physical exam and medical decision making activities of this service and have verified that the service and findings are accurately documented in the student's note.  Altheia Shafran, DO                  11/02/2023, 12:01 PM

## 2023-11-02 NOTE — H&P (View-Only) (Signed)
 Consultation Note   Referring Provider:  Family Medicine Teaching Service PCP: Tharon Lung, MD Primary Gastroenterologist:  [facility]       Reason for Consultation:   Periumbilical pain, weight loss        DOA: 11/01/2023         Hospital Day: 2   ASSESSMENT    Patient Profile:  38 y.o. year old female with a medical history which includes but may not be limited to a ruptured appendix status post laparoscopic appendectomy 2022.  Admitted with involuntary weight loss, periumbilical pain.   Periumbilical pain, worse after eating / unintentional 13 pound weight loss over the last month   Workup overall unremarkable including LFTs, lipase, CBC, systemic inflammatory markers,  CTAP with IV contrast.  Etiology unclear, possibly adhesive disease?  Small bowel pathology?     PLAN:   --Will proceed with EGD tomorrow.  The risks and benefits of EGD with possible biopsies were discussed with the patient who agrees to proceed.  - If EGD negative consider CT enterography, especially since CT scan this admission was without oral contrast.    HPI   Leala began having periumbical pain approximately 1 month ago.  Initially the pain was only postprandial.  It would occur almost immediately after eating and last a couple of minutes.  Over the last several days she developed more of a constant low-grade throbbing periumbilical pain which was exacerbated by eating which caused the pain become sharp again.  Pain does not radiate anywhere.  The pain is not relieved with defecation.  She has not had any change in her bowel movements.  No blood in stool no vaginal symptoms.  No urinary symptoms .   She has not had any associated nausea or vomiting.  She has avoided food due to the pain it causes.  Consequently she has lost about 13 pounds over the last month.   There was mention of dysphagia when we were asked to see this patient in consultation.  However, I  asked the patient several times about any swallowing issues which she adamantly denied.  She did have evaluation by SLP and her oropharyngeal swallow appears to be functional across the consistencies that were tested   Labs and Imaging:  Recent Labs    11/01/23 1808 11/02/23 0539  PROT 8.0 7.1  ALBUMIN 4.2 3.7  AST 19 15  ALT 18 16  ALKPHOS 39 34*  BILITOT 0.7 0.9   Recent Labs    11/01/23 1808  WBC 9.9  HGB 12.6  HCT 37.5  MCV 89.1  PLT 234   Recent Labs    11/01/23 1808 11/02/23 0539  NA 137 138  K 4.0 4.0  CL 102 106  CO2 23 27  GLUCOSE 93 85  BUN 9 12  CREATININE 0.68 0.75  CALCIUM 9.4 9.1     CT ABDOMEN PELVIS W CONTRAST CLINICAL DATA:  Abdominal pain, weight loss  EXAM: CT ABDOMEN AND PELVIS WITH CONTRAST  TECHNIQUE: Multidetector CT imaging of the abdomen and pelvis was performed using the standard protocol following bolus administration of intravenous contrast.  RADIATION DOSE REDUCTION: This exam was performed according to the departmental dose-optimization program which includes automated exposure control, adjustment of the  mA and/or kV according to patient size and/or use of iterative reconstruction technique.  CONTRAST:  60mL OMNIPAQUE  IOHEXOL  350 MG/ML SOLN  COMPARISON:  04/25/2020  FINDINGS: Lower chest: No acute abnormality  Hepatobiliary: No focal hepatic abnormality. Gallbladder unremarkable.  Pancreas: No focal abnormality or ductal dilatation.  Spleen: No focal abnormality.  Normal size.  Adrenals/Urinary Tract: No adrenal abnormality. No focal renal abnormality. No stones or hydronephrosis. Urinary bladder decompressed.  Stomach/Bowel: Stomach, large and small bowel grossly unremarkable. Prior appendectomy.  Vascular/Lymphatic: No evidence of aneurysm or adenopathy.  Reproductive: Uterus and adnexa unremarkable.  No mass.  Other: No free fluid or free air.  Musculoskeletal: No acute bony  abnormality.  IMPRESSION: No acute findings in the abdomen or pelvis.  Electronically Signed   By: Franky Crease M.D.   On: 11/02/2023 00:05  Past Medical History:  Diagnosis Date   Normal pregnancy, repeat 07/01/2011   S/P laparoscopy 02/20/2015   S/P laparoscopy 02/20/2015   SVD (spontaneous vaginal delivery) 07/02/2011   SVD (spontaneous vaginal delivery) 09/10/2016    Past Surgical History:  Procedure Laterality Date   HYSTEROSCOPY N/A 01/07/2015   Procedure: HYSTEROSCOPY DIAGNOSTIC, ATTEMPTED REMOVAL OF iNTRAUTERINE DEVICE;  Surgeon: Ezzie Buba, MD;  Location: [facility];  Service: Gynecology;  Laterality: N/A;  MD requests 1hr OR time   IR RADIOLOGIST EVAL & MGMT  04/25/2020   IUD REMOVAL N/A 02/20/2015   Procedure: INTRAUTERINE DEVICE (IUD) REMOVAL;  Surgeon: Ezzie Buba, MD;  Location: [facility];  Service: Gynecology;  Laterality: N/A;   LAPAROSCOPIC APPENDECTOMY N/A 05/28/2020   Procedure: APPENDECTOMY LAPAROSCOPIC;  Surgeon: Signe Mitzie LABOR, MD;  Location: [facility];  Service: General;  Laterality: N/A;  START TIME OF 9:45 AM FOR 90 MINUTES ROOM 2   LAPAROSCOPY N/A 02/20/2015   Procedure: LAPAROSCOPY OPERATIVE;  Surgeon: Ezzie Buba, MD;  Location: [facility];  Service: Gynecology;  Laterality: N/A;   NO PAST SURGERIES      No family history on file.  Prior to Admission medications   Not on File    Current Facility-Administered Medications  Medication Dose Route Frequency Provider Last Rate Last Admin   lactated ringers  infusion   Intravenous Continuous Everhart, Kirstie, DO       multivitamin with minerals tablet 1 tablet  1 tablet Oral Daily Faunce, Alina, DO   1 tablet at 11/02/23 0924    Allergies as of 11/01/2023 - Review Complete 11/01/2023  Allergen Reaction Noted   Beef-derived drug products  11/01/2023   Pork-derived products  11/01/2023    Social History   Socioeconomic History   Marital status: Married    Spouse name: Not on file    Number of children: Not on file   Years of education: Not on file   Highest education level: Not on file  Occupational History   Not on file  Tobacco Use   Smoking status: Never   Smokeless tobacco: Never  Vaping Use   Vaping status: Never Used  Substance and Sexual Activity   Alcohol use: No   Drug use: No   Sexual activity: Yes  Other Topics Concern   Not on file  Social History Narrative   Not on file   Social Drivers of Health   Financial Resource Strain: Not on file  Food Insecurity: No Food Insecurity (11/01/2023)   Hunger Vital Sign    Worried About Running Out of Food in the Last Year: Never true    Ran Out of Food in the Last Year:  Never true  Transportation Needs: No Transportation Needs (11/01/2023)   PRAPARE - Administrator, Civil Service (Medical): No    Lack of Transportation (Non-Medical): No  Physical Activity: Not on file  Stress: Not on file  Social Connections: Not on file  Intimate Partner Violence: Not At Risk (11/01/2023)   Humiliation, Afraid, Rape, and Kick questionnaire    Fear of Current or Ex-Partner: No    Emotionally Abused: No    Physically Abused: No    Sexually Abused: No     Code Status   Code Status: Full Code  Review of Systems: All systems reviewed and negative except where noted in HPI.  Physical Exam: Vital signs in last 24 hours: Temp:  [98 F (36.7 C)-98.8 F (37.1 C)] 98 F (36.7 C) (08/19 0807) Pulse Rate:  [58-71] 67 (08/19 0807) BP: (85-110)/(53-74) 90/53 (08/19 0807) SpO2:  [98 %-100 %] 99 % (08/19 0807) Weight:  [42 kg] 42 kg (08/18 1619)    General:  Pleasant thin female in NAD Psych:  Cooperative. Normal mood and affect Eyes: Pupils equal Ears:  Normal auditory acuity Nose: No deformity, discharge or lesions Neck:  Supple, no masses felt Lungs:  Clear to auscultation.  Heart:  Regular rate, regular rhythm.  Abdomen:  Soft, nondistended, periumbilical area is TTP.  Active bowel sounds, no  masses felt Rectal :  Deferred Msk: Symmetrical without gross deformities.  Neurologic:  Alert, oriented, grossly normal neurologically Extremities : No edema Skin:  Intact without significant lesions.    Intake/Output from previous day: No intake/output data recorded. Intake/Output this shift:  No intake/output data recorded.   Vina Dasen, NP-C   11/02/2023, 1:30 PM  I personally saw the patient and performed a substantive portion of this encounter (>50% time spent), including a complete performance of at least one of the key components (MDM, Hx and/or Exam), in conjunction with the APP.  I agree with the APP's note, impression, and recommendations with additional input as follows.   38 year old female with history of appendectomy in 2022 presented with weight loss and periumbilical abdominal pain.  Her abdominal pain worsens after eating.  Also describes some globus sensation.  Speech therapy found no issues with oropharyngeal dysphagia.  Labs upon arrival are unremarkable including a normal CRP.  CT A/P with contrast is also normal upon arrival, though this was done without oral contrast.  Will plan for EGD tomorrow for evaluation.  N.p.o. after midnight.  If EGD is negative, then could consider CT enterography for further evaluation of the small bowel.  Estefana Kidney, MD

## 2023-11-02 NOTE — Evaluation (Signed)
 Clinical/Bedside Swallow Evaluation Patient Details  Name: Natalie [name] MRN: [id number] Date of Birth: 17-Apr-1985  Today's Date: 11/02/2023 Time: SLP Start Time (ACUTE ONLY): 1407 SLP Stop Time (ACUTE ONLY): 1433 SLP Time Calculation (min) (ACUTE ONLY): 26 min  Past Medical History:  Past Medical History:  Diagnosis Date   Normal pregnancy, repeat 07/01/2011   S/P laparoscopy 02/20/2015   S/P laparoscopy 02/20/2015   SVD (spontaneous vaginal delivery) 07/02/2011   SVD (spontaneous vaginal delivery) 09/10/2016   Past Surgical History:  Past Surgical History:  Procedure Laterality Date   HYSTEROSCOPY N/A 01/07/2015   Procedure: HYSTEROSCOPY DIAGNOSTIC, ATTEMPTED REMOVAL OF iNTRAUTERINE DEVICE;  Surgeon: Ezzie Buba, MD;  Location: [facility];  Service: Gynecology;  Laterality: N/A;  MD requests 1hr OR time   IR RADIOLOGIST EVAL & MGMT  04/25/2020   IUD REMOVAL N/A 02/20/2015   Procedure: INTRAUTERINE DEVICE (IUD) REMOVAL;  Surgeon: Ezzie Buba, MD;  Location: [facility];  Service: Gynecology;  Laterality: N/A;   LAPAROSCOPIC APPENDECTOMY N/A 05/28/2020   Procedure: APPENDECTOMY LAPAROSCOPIC;  Surgeon: Signe Mitzie LABOR, MD;  Location: [facility];  Service: General;  Laterality: N/A;  START TIME OF 9:45 AM FOR 90 MINUTES ROOM 2   LAPAROSCOPY N/A 02/20/2015   Procedure: LAPAROSCOPY OPERATIVE;  Surgeon: Ezzie Buba, MD;  Location: [facility];  Service: Gynecology;  Laterality: N/A;   NO PAST SURGERIES     HPI:  Natalie [name] is a 38 y.o. female adm 11/01/23 with PMHx/PSHx of appendectomy (2022) and IUD perforation and removal (2016), presented with worsening unintentional weight loss and periumbilical pain.    Assessment / Plan / Recommendation  Clinical Impression  Pt's oropharyngeal swallow appears to be functional across consistencies tested. Her symptoms overall appear to be more esophageal in nature, including periumbilical pain after eating, globus sensation, feeling  like she needs to vomit (no actual emesis per her report), and solids/pills feeling stuck. She describes choking on air, which occurs when she is not eating, and the only other time she had experienced this was when she was pregnant (?LPR). She also says that in the past, although she has not experienced this type of pain before, she has had an upset stomach from eating dinner too close to bedtime, so she does not typically eat dinner anymore. SLP provided education about general esophageal precautions as well as foods that may be more likely to trigger reflux (pt drinks a lot of coffee, and often only has coffee for breakfast). Would recommend a more dedicated assessment of the esophagus (note GI being consulted per MD note). SLP will f/u briefly for consideration of instrumental testing if their testing is inconclusive. Would leave on a regular diet and thin liquids for now.  SLP Visit Diagnosis: Dysphagia, unspecified (R13.10)    Aspiration Risk       Diet Recommendation Regular;Thin liquid    Liquid Administration via: Cup;Straw Medication Administration: Whole meds with liquid Supervision: Patient able to self feed Compensations: Slow rate;Small sips/bites;Follow solids with liquid Postural Changes: Seated upright at 90 degrees;Remain upright for at least 30 minutes after po intake    Other  Recommendations Oral Care Recommendations: Oral care BID     Assistance Recommended at Discharge    Functional Status Assessment Patient has had a recent decline in their functional status and demonstrates the ability to make significant improvements in function in a reasonable and predictable amount of time.  Frequency and Duration min 2x/week  1 week       Prognosis Prognosis for  improved oropharyngeal function: Good      Swallow Study   General HPI: Natalie [name] is a 38 y.o. female adm 11/01/23 with PMHx/PSHx of appendectomy (2022) and IUD perforation and removal (2016), presented with  worsening unintentional weight loss and periumbilical pain. Type of Study: Bedside Swallow Evaluation Previous Swallow Assessment: none in chart Diet Prior to this Study: Regular;Thin liquids (Level 0) Temperature Spikes Noted: No Respiratory Status: Room air History of Recent Intubation: No Behavior/Cognition: Alert;Cooperative;Pleasant mood Oral Cavity Assessment: Within Functional Limits Oral Care Completed by SLP: No Oral Cavity - Dentition: Adequate natural dentition Vision: Functional for self-feeding Self-Feeding Abilities: Able to feed self Patient Positioning: Upright in bed Baseline Vocal Quality: Normal Volitional Cough: Strong Volitional Swallow: Able to elicit    Oral/Motor/Sensory Function Overall Oral Motor/Sensory Function: Within functional limits   Ice Chips Ice chips: Not tested   Thin Liquid Thin Liquid: Within functional limits Presentation: Cup;Self Fed;Straw    Nectar Thick Nectar Thick Liquid: Not tested   Honey Thick Honey Thick Liquid: Not tested   Puree Puree: Within functional limits Presentation: Self Fed;Spoon   Solid     Solid: Within functional limits Presentation: Self Fed      Leita SAILOR., M.A. CCC-SLP Acute Rehabilitation Services Office: [phone]  Secure chat preferred  11/02/2023,2:37 PM

## 2023-11-02 NOTE — Plan of Care (Signed)

## 2023-11-02 NOTE — Progress Notes (Signed)
 Initial Nutrition Assessment  DOCUMENTATION CODES:   Underweight  INTERVENTION:   -Ensure Plus High Protein po TID, each supplement provides 350 kcal and 20 grams of protein  -MVI with minerals daily -100 mg thiamine  daily -Monitor Mg, K, and Phos and replete as needed secondary to high refeeding risk -Continue regular diet for widest variety of meal selections -RD attached High Calorie, High Protein Nutrition Therapy handout from [facility] Nutrition Care Manual; attached to AVS/ discharge summary   NUTRITION DIAGNOSIS:   Inadequate oral intake related to poor appetite as evidenced by per patient/family report.  GOAL:   Patient will meet greater than or equal to 90% of their needs  MONITOR:   PO intake, Supplement acceptance  REASON FOR ASSESSMENT:   Consult Assessment of nutrition requirement/status, Poor PO  ASSESSMENT:   Pt  presents with unintentional weight loss and periumbilical pain. Differential for presentation of this include GI pathology (r/o malignancy, IBD, celiac disease, PUD, pancreatitis), will also evaluate renal and liver function.  Pt admitted with unintentional weight loss.   Pt unavailable at time of visit. Attempted to speak with pt via call to hospital room phone, however, unable to reach. RD unable to obtain further nutrition-related history or complete nutrition-focused physical exam at this time.    Per MD notes, noted plan for GI consult and possible EGD/ colonoscopy. Also considering H pylori testing.   Per H&P, pt endorses decreased appetite secondary to umbilical pain for 1 month PTA.   Case discussed with RN and nurse tech, who reports pt consumed a little breakfast today. RN denies any difficulty swallowing, other than the large MVI she took. Pt is not ordered for supplements, however, RN thinks that she will drink Ensure.   Reviewed wt hx; pt has experienced a 10.1% wt loss over the past 5 months, which is significant for time frame.    Given history of poor oral intake and significant weight loss, highly suspect pt with malnutrition, however, unable to identify at this time. RD will order supplements and monitor labs for potential refeeding risk.   Medications reviewed and include lactated ringers  @ 40 ml/hr.   Labs reviewed.    Diet Order:   Diet Order             Diet regular Room service appropriate? Yes; Fluid consistency: Thin  Diet effective now                   EDUCATION NEEDS:   No education needs have been identified at this time  Skin:  Skin Assessment: Reviewed RN Assessment  Last BM:  Unknown  Height:   Ht Readings from Last 1 Encounters:  12/24/22 5' 2 (1.575 m)    Weight:   Wt Readings from Last 1 Encounters:  11/01/23 42 kg    Ideal Body Weight:  50 kg  BMI:  Body mass index is 16.94 kg/m.  Estimated Nutritional Needs:   Kcal:  1650-1850  Protein:  85-100 grams  Fluid:  1.6-1.8 L    Margery ORN, RD, LDN, CDCES Registered Dietitian III Certified Diabetes Care and Education Specialist If unable to reach this RD, please use RD Inpatient group chat on secure chat between hours of 8am-4 pm daily

## 2023-11-02 NOTE — Assessment & Plan Note (Addendum)
-   Dietician consult, reg diet pending recs - Monitor for refeeding syndrome: phos, mag, potassium - PT/OT pending recs

## 2023-11-02 NOTE — Assessment & Plan Note (Signed)
>>  ASSESSMENT AND PLAN FOR WEIGHT LOSS, UNINTENTIONAL  POSTPRANDIAL ABDOMINAL PAIN WRITTEN ON 11/02/2023 12:09 PM BY EVERHART, KIRSTIE, DO  Labs and imaging workup so far unremarkable. 13% weight loss in 5 months. March 2025 103 lb >>90 lb today. Decreased intake due to constant postprandial pain.  - tTG-IgA pending - Will obtain GI consult, possible EGD/colonoscopy - LR 40ml/h IVF - AM CBC, BMP

## 2023-11-02 NOTE — Evaluation (Signed)
 Physical Therapy Evaluation and Discharge Patient Details Name: Natalie [name] MRN: [id number] DOB: 17-May-1985 Today's Date: 11/02/2023  History of Present Illness  Natalie [name] is a 38 y.o. female adm 11/01/23 with PMHx/PSHx of appendectomy (2022) and IUD perforation and removal (2016), presented with worsening unintentional weight loss and periumbilical pain.  Clinical Impression  Patient evaluated by Physical Therapy with no further acute PT needs identified. All education has been completed and the patient has no further questions. Managing well; OK for dc home from PT standpoint;  See below for any follow-up Physical Therapy or equipment needs. PT is signing off. Thank you for this referral.         If plan is discharge home, recommend the following: Assist for transportation;Assistance with cooking/housework   Can travel by private vehicle        Equipment Recommendations None recommended by PT  Recommendations for Other Services       Functional Status Assessment Patient has had a recent decline in their functional status and demonstrates the ability to make significant improvements in function in a reasonable and predictable amount of time.     Precautions / Restrictions Precautions Precautions: None Restrictions Weight Bearing Restrictions Per Provider Order: No      Mobility  Bed Mobility Overal bed mobility: Independent                  Transfers Overall transfer level: Independent Equipment used: None                    Ambulation/Gait Ambulation/Gait assistance: Independent Gait Distance (Feet): 1200 Feet Assistive device: None Gait Pattern/deviations: WFL(Within Functional Limits)       General Gait Details: slow cadence, and tending to keep her hands on her belly  Stairs            Wheelchair Mobility     Tilt Bed    Modified Rankin (Stroke Patients Only)       Balance Overall balance assessment: No apparent  balance deficits (not formally assessed)                                           Pertinent Vitals/Pain Pain Assessment Pain Assessment: Faces Faces Pain Scale: Hurts little more Pain Location: abdomen Pain Descriptors / Indicators: Jabbing Pain Intervention(s): Monitored during session    Home Living Family/patient expects to be discharged to:: Private residence Living Arrangements: Spouse/significant other;Children Available Help at Discharge: Family;Available PRN/intermittently Type of Home: House Home Access: Level entry       Home Layout: One level Home Equipment: None      Prior Function Prior Level of Function : Independent/Modified Independent;Working/employed;Driving                     Extremity/Trunk Assessment   Upper Extremity Assessment Upper Extremity Assessment: Defer to OT evaluation    Lower Extremity Assessment Lower Extremity Assessment: Overall WFL for tasks assessed    Cervical / Trunk Assessment Cervical / Trunk Assessment: Normal  Communication   Communication Communication: No apparent difficulties    Cognition Arousal: Alert Behavior During Therapy: WFL for tasks assessed/performed                             Following commands: Intact       Cueing Cueing Techniques: Verbal  cues     General Comments      Exercises     Assessment/Plan    PT Assessment Patient does not need any further PT services  PT Problem List         PT Treatment Interventions      PT Goals (Current goals can be found in the Care Plan section)  Acute Rehab PT Goals Patient Stated Goal: Hopes to be able to get home soon PT Goal Formulation: All assessment and education complete, DC therapy    Frequency       Co-evaluation               AM-PAC PT 6 Clicks Mobility  Outcome Measure Help needed turning from your back to your side while in a flat bed without using bedrails?: None Help needed  moving from lying on your back to sitting on the side of a flat bed without using bedrails?: None Help needed moving to and from a bed to a chair (including a wheelchair)?: None Help needed standing up from a chair using your arms (e.g., wheelchair or bedside chair)?: None Help needed to walk in hospital room?: None Help needed climbing 3-5 steps with a railing? : None 6 Click Score: 24    End of Session   Activity Tolerance: Patient tolerated treatment well Patient left: Other (comment) (Managing independently in her room)   PT Visit Diagnosis: Other abnormalities of gait and mobility (R26.89)    Time: 8842-8791 PT Time Calculation (min) (ACUTE ONLY): 11 min   Charges:   PT Evaluation $PT Eval Low Complexity: 1 Low   PT General Charges $$ ACUTE PT VISIT: 1 Visit         Silvano Currier, PT  Acute Rehabilitation Services Office [phone] Secure Chat welcomed   Silvano VEAR Currier 11/02/2023, 2:22 PM

## 2023-11-02 NOTE — Discharge Instructions (Addendum)
 High-Calorie, High-Protein Nutrition Therapy (2021) A high-calorie, high-protein diet has been recommended to you. Your registered dietitian nutritionist (RDN) may have recommended this diet because you are having difficulty eating enough calories throughout the day, you have lost weight, and/or you need to add protein to your diet. Sometimes you may not feel like eating, even if you know the importance of good nutrition. The recommendations in this handout can help you with the following: Regaining your strength and energy Keeping your body healthy Healing and recovering from surgery or illness and fighting infection Tips: Schedule Your Meals and Snacks Several small meals and snacks are often better tolerated and digested than large meals. Strategies Plan to eat 3 meals and 3 snacks daily. Experiment with timing meals to find out when you have a larger appetite. Appetite may be greatest in the morning after not eating all night so you may prefer to eat your larger meals and snacks in the morning and at lunch. Breakfast-type foods are often better tolerated so eat foods such as eggs, pancakes, waffles and cereal for any meal or snack. Carry snacks with you so you are prepared to eat every 2 to 3 hours. Determine what works best for you if your body's cues for feeling hungry or full are not working. Eat a small meal or snack even if you don't feel hungry. Set a timer to remind you when it is time to eat. Take a walk before you eat (with health care provider's approval). Light or moderate physical activity can help you maintain muscle and increase your appetite. Make Eating Enjoyable Taking steps to make the experience enjoyable may help to increase your interest in eating and improve your appetite. Strategies: Eat with others whenever possible. Include your favorite foods to make meals more enjoyable. Try new foods. Save your beverage for the end of the meal so that you have more room for  food before you get full. Add Calories to Your Meals and Snacks Try adding calorie-dense foods so that each bite provides more nutrition. Strategies Drink milk, chocolate milk, soy milk, or smoothies instead of low-calorie beverages such as diet drinks or water. Cook with milk or soy milk instead of water when making dishes such as hot cereal, cocoa, or pudding. Add jelly, jam, honey, butter or margarine to bread and crackers. Add jam or fruit to ice cream and as a topping over cake. Mix dried fruit, nuts, granola, honey, or dry cereal with yogurt or hot cereals. Enjoy snacks such as milkshakes, smoothies, pudding, ice cream, or custard. Blend a fruit smoothie of a banana, frozen berries, milk or soy milk, and 1 tablespoon nonfat powdered milk or protein powder. Add Protein to Your Meals and Snacks Choose at least one protein food at each meal and snack to increase your daily intake. Strategies Add  cup nonfat dry milk powder or protein powder to make a high-protein milk to drink or to use in recipes that call for milk. Vanilla or peppermint extract or unsweetened cocoa powder could help to boost the flavor. Add hard-cooked eggs, leftover meat, grated cheese, canned beans or tofu to noodles, rice, salads, sandwiches, soups, casseroles, pasta, tuna and other mixed dishes. Add powdered milk or protein powder to hot cereals, meatloaf, casseroles, scrambled eggs, sauces, cream soups, and shakes. Add beans and lentils to salads, soups, casseroles, and vegetable dishes. Eat cottage cheese or yogurt, especially Greek yogurt, with fruit as a snack or dessert. Eat peanut or other nut butters on crackers, bread, toast,  waffles, apples, bananas or celery sticks. Add it to milkshakes, smoothies, or desserts. Consider a ready-made protein shake. Your RDN will make recommendations. Add Fats to Your Meals and Snacks Try adding fats to your meals and snacks. Fat provides more calories in fewer bites than  carbohydrate or protein and adds flavors to your foods. Strategies Snack on nuts and seeds or add them to foods like salads, pasta, cereals, yogurt, and ice cream.  Saut or stir-fry vegetables, meats, chicken, fish or tofu in olive or canola oil.  Add olive oil, other vegetable oils, butter or margarine to soups, vegetables, potatoes, cooked cereal, rice, pasta, bread, crackers, pancakes, or waffles. Snack on olives or add to pasta, pizza, or salad. Add avocado or guacamole to your salads, sandwiches, and other entrees. Include fatty fish such as salmon in your weekly meal plan. For general food safety tips, especially for clients with immunocompromised conditions, ask your RDN for the Food Safety Nutrition Therapy handout. Small Meal and Snack Ideas These snacks and meals are recommended when you have to eat but aren't necessarily hungry.  They are good choices because they are high in protein and high in calories.  2 graham crackers 2 tablespoons peanut or other nut butter 1 cup milk 2 slices whole wheat toast topped with:  avocado, mashed Seasoning of your choice   cup Greek yogurt  cup fruit  cup granola 2 deviled egg halves 5 whole wheat crackers  1 cup cream of tomato soup  grilled cheese sandwich 1 toasted waffle topped with: 2 tablespoons peanut or nut butter 1 tablespoon jam  Trail mix made with:  cup nuts  cup dried fruit  cup cold cereal, any variety  cup oatmeal or cream of wheat cereal 1 tablespoon peanut or nut butter  cup diced fruit   High-Calorie, High-Protein Sample 1-Day Menu View Nutrient Info Breakfast 1 egg, scrambled 1 ounce cheddar cheese 1 English muffin, whole wheat 1 tablespoon margarine 1 tablespoon jam  cup orange juice, fortified with calcium and vitamin D   Morning Snack 1 tablespoon peanut butter 1 banana 1 cup 1% milk  Lunch Tuna salad sandwich made with: 2 slices bread, whole wheat 3 ounces tuna mixed with: 1 tablespoon  mayonnaise  cup pudding  Afternoon Snack  cup hummus  cup carrots 1 pita  Evening Meal Enchilada casserole made with: 2 corn tortillas 3 ounces ground beef, cooked  cup black beans, cooked  cup corn, cooked 1 ounce grated cheddar cheese  cup enchilada sauce  avocado, sliced, topping for enchilada 1 tablespoon sour cream, topping for enchilada Salad:  cup lettuce, shredded  cup tomatoes, chopped, for salad 1 tablespoon olive oil and vinegar dressing, for salad  Evening Snack  cup Greek yogurt  cup blueberries  cup granola    Nutrition Post Hospital Stay Proper nutrition can help your body recover from illness and injury.   Foods and beverages high in protein, vitamins, and minerals help rebuild muscle loss, promote healing, & reduce fall risk.   In addition to eating healthy foods, a nutrition shake is an easy, delicious way to get the nutrition you need during and after your hospital stay  It is recommended that you continue to drink 2 bottles per day of:       Ensure Plus for at least 1 month (30 days) after your hospital stay   Tips for adding a nutrition shake into your routine: As allowed, drink one with vitamins or medications instead of water or juice Enjoy  one as a tasty mid-morning or afternoon snack Drink cold or make a milkshake out of it Drink one instead of milk with cereal or snacks Use as a coffee creamer   Available at the following grocery stores and pharmacies:           * [place] [facility] * [employer] * [employer]  * [employer]          * [employer] * [employer]  * [facility]      * Target  * [employer]   * [facility]  * [employer]   * [facility] [phone]            For COUPONS visit: www.ensure.com/join or RoleLink.com.br   Suggested Substitutions Ensure Plus = Boost Plus = Carnation Breakfast Essentials = Boost Compact Ensure Active Clear = Boost Breeze Glucerna Shake = Boost Glucose Control = Carnation Breakfast Essentials  SUGAR FREE

## 2023-11-02 NOTE — Consult Note (Addendum)
 Consultation Note   Referring Provider:  Family Medicine Teaching Service PCP: Tharon Lung, MD Primary Gastroenterologist:  [facility]       Reason for Consultation:   Periumbilical pain, weight loss        DOA: 11/01/2023         Hospital Day: 2   ASSESSMENT    Patient Profile:  38 y.o. year old female with a medical history which includes but may not be limited to a ruptured appendix status post laparoscopic appendectomy 2022.  Admitted with involuntary weight loss, periumbilical pain.   Periumbilical pain, worse after eating / unintentional 13 pound weight loss over the last month   Workup overall unremarkable including LFTs, lipase, CBC, systemic inflammatory markers,  CTAP with IV contrast.  Etiology unclear, possibly adhesive disease?  Small bowel pathology?     PLAN:   --Will proceed with EGD tomorrow.  The risks and benefits of EGD with possible biopsies were discussed with the patient who agrees to proceed.  - If EGD negative consider CT enterography, especially since CT scan this admission was without oral contrast.    HPI   Natalie [name] began having periumbical pain approximately 1 month ago.  Initially the pain was only postprandial.  It would occur almost immediately after eating and last a couple of minutes.  Over the last several days she developed more of a constant low-grade throbbing periumbilical pain which was exacerbated by eating which caused the pain become sharp again.  Pain does not radiate anywhere.  The pain is not relieved with defecation.  She has not had any change in her bowel movements.  No blood in stool no vaginal symptoms.  No urinary symptoms .   She has not had any associated nausea or vomiting.  She has avoided food due to the pain it causes.  Consequently she has lost about 13 pounds over the last month.   There was mention of dysphagia when we were asked to see this patient in consultation.  However, I  asked the patient several times about any swallowing issues which she adamantly denied.  She did have evaluation by SLP and her oropharyngeal swallow appears to be functional across the consistencies that were tested   Labs and Imaging:  Recent Labs    11/01/23 1808 11/02/23 0539  PROT 8.0 7.1  ALBUMIN 4.2 3.7  AST 19 15  ALT 18 16  ALKPHOS 39 34*  BILITOT 0.7 0.9   Recent Labs    11/01/23 1808  WBC 9.9  HGB 12.6  HCT 37.5  MCV 89.1  PLT 234   Recent Labs    11/01/23 1808 11/02/23 0539  NA 137 138  K 4.0 4.0  CL 102 106  CO2 23 27  GLUCOSE 93 85  BUN 9 12  CREATININE 0.68 0.75  CALCIUM 9.4 9.1     CT ABDOMEN PELVIS W CONTRAST CLINICAL DATA:  Abdominal pain, weight loss  EXAM: CT ABDOMEN AND PELVIS WITH CONTRAST  TECHNIQUE: Multidetector CT imaging of the abdomen and pelvis was performed using the standard protocol following bolus administration of intravenous contrast.  RADIATION DOSE REDUCTION: This exam was performed according to the departmental dose-optimization program which includes automated exposure control, adjustment of the  mA and/or kV according to patient size and/or use of iterative reconstruction technique.  CONTRAST:  60mL OMNIPAQUE  IOHEXOL  350 MG/ML SOLN  COMPARISON:  04/25/2020  FINDINGS: Lower chest: No acute abnormality  Hepatobiliary: No focal hepatic abnormality. Gallbladder unremarkable.  Pancreas: No focal abnormality or ductal dilatation.  Spleen: No focal abnormality.  Normal size.  Adrenals/Urinary Tract: No adrenal abnormality. No focal renal abnormality. No stones or hydronephrosis. Urinary bladder decompressed.  Stomach/Bowel: Stomach, large and small bowel grossly unremarkable. Prior appendectomy.  Vascular/Lymphatic: No evidence of aneurysm or adenopathy.  Reproductive: Uterus and adnexa unremarkable.  No mass.  Other: No free fluid or free air.  Musculoskeletal: No acute bony  abnormality.  IMPRESSION: No acute findings in the abdomen or pelvis.  Electronically Signed   By: Franky Crease M.D.   On: 11/02/2023 00:05  Past Medical History:  Diagnosis Date   Normal pregnancy, repeat 07/01/2011   S/P laparoscopy 02/20/2015   S/P laparoscopy 02/20/2015   SVD (spontaneous vaginal delivery) 07/02/2011   SVD (spontaneous vaginal delivery) 09/10/2016    Past Surgical History:  Procedure Laterality Date   HYSTEROSCOPY N/A 01/07/2015   Procedure: HYSTEROSCOPY DIAGNOSTIC, ATTEMPTED REMOVAL OF iNTRAUTERINE DEVICE;  Surgeon: Ezzie Buba, MD;  Location: [facility];  Service: Gynecology;  Laterality: N/A;  MD requests 1hr OR time   IR RADIOLOGIST EVAL & MGMT  04/25/2020   IUD REMOVAL N/A 02/20/2015   Procedure: INTRAUTERINE DEVICE (IUD) REMOVAL;  Surgeon: Ezzie Buba, MD;  Location: [facility];  Service: Gynecology;  Laterality: N/A;   LAPAROSCOPIC APPENDECTOMY N/A 05/28/2020   Procedure: APPENDECTOMY LAPAROSCOPIC;  Surgeon: Signe Mitzie LABOR, MD;  Location: [facility];  Service: General;  Laterality: N/A;  START TIME OF 9:45 AM FOR 90 MINUTES ROOM 2   LAPAROSCOPY N/A 02/20/2015   Procedure: LAPAROSCOPY OPERATIVE;  Surgeon: Ezzie Buba, MD;  Location: [facility];  Service: Gynecology;  Laterality: N/A;   NO PAST SURGERIES      No family history on file.  Prior to Admission medications   Not on File    Current Facility-Administered Medications  Medication Dose Route Frequency Provider Last Rate Last Admin   lactated ringers  infusion   Intravenous Continuous Everhart, Kirstie, DO       multivitamin with minerals tablet 1 tablet  1 tablet Oral Daily Faunce, Alina, DO   1 tablet at 11/02/23 0924    Allergies as of 11/01/2023 - Review Complete 11/01/2023  Allergen Reaction Noted   Beef-derived drug products  11/01/2023   Pork-derived products  11/01/2023    Social History   Socioeconomic History   Marital status: Married    Spouse name: Not on file    Number of children: Not on file   Years of education: Not on file   Highest education level: Not on file  Occupational History   Not on file  Tobacco Use   Smoking status: Never   Smokeless tobacco: Never  Vaping Use   Vaping status: Never Used  Substance and Sexual Activity   Alcohol use: No   Drug use: No   Sexual activity: Yes  Other Topics Concern   Not on file  Social History Narrative   Not on file   Social Drivers of Health   Financial Resource Strain: Not on file  Food Insecurity: No Food Insecurity (11/01/2023)   Hunger Vital Sign    Worried About Running Out of Food in the Last Year: Never true    Ran Out of Food in the Last Year:  Never true  Transportation Needs: No Transportation Needs (11/01/2023)   PRAPARE - Administrator, Civil Service (Medical): No    Lack of Transportation (Non-Medical): No  Physical Activity: Not on file  Stress: Not on file  Social Connections: Not on file  Intimate Partner Violence: Not At Risk (11/01/2023)   Humiliation, Afraid, Rape, and Kick questionnaire    Fear of Current or Ex-Partner: No    Emotionally Abused: No    Physically Abused: No    Sexually Abused: No     Code Status   Code Status: Full Code  Review of Systems: All systems reviewed and negative except where noted in HPI.  Physical Exam: Vital signs in last 24 hours: Temp:  [98 F (36.7 C)-98.8 F (37.1 C)] 98 F (36.7 C) (08/19 0807) Pulse Rate:  [58-71] 67 (08/19 0807) BP: (85-110)/(53-74) 90/53 (08/19 0807) SpO2:  [98 %-100 %] 99 % (08/19 0807) Weight:  [42 kg] 42 kg (08/18 1619)    General:  Pleasant thin female in NAD Psych:  Cooperative. Normal mood and affect Eyes: Pupils equal Ears:  Normal auditory acuity Nose: No deformity, discharge or lesions Neck:  Supple, no masses felt Lungs:  Clear to auscultation.  Heart:  Regular rate, regular rhythm.  Abdomen:  Soft, nondistended, periumbilical area is TTP.  Active bowel sounds, no  masses felt Rectal :  Deferred Msk: Symmetrical without gross deformities.  Neurologic:  Alert, oriented, grossly normal neurologically Extremities : No edema Skin:  Intact without significant lesions.    Intake/Output from previous day: No intake/output data recorded. Intake/Output this shift:  No intake/output data recorded.   Vina Dasen, NP-C   11/02/2023, 1:30 PM  I personally saw the patient and performed a substantive portion of this encounter (>50% time spent), including a complete performance of at least one of the key components (MDM, Hx and/or Exam), in conjunction with the APP.  I agree with the APP's note, impression, and recommendations with additional input as follows.   38 year old female with history of appendectomy in 2022 presented with weight loss and periumbilical abdominal pain.  Her abdominal pain worsens after eating.  Also describes some globus sensation.  Speech therapy found no issues with oropharyngeal dysphagia.  Labs upon arrival are unremarkable including a normal CRP.  CT A/P with contrast is also normal upon arrival, though this was done without oral contrast.  Will plan for EGD tomorrow for evaluation.  N.p.o. after midnight.  If EGD is negative, then could consider CT enterography for further evaluation of the small bowel.  Estefana Kidney, MD

## 2023-11-02 NOTE — Assessment & Plan Note (Addendum)
 Labs and imaging workup so far unremarkable. 13% weight loss in 5 months. March 2025 103 lb >>90 lb today. Decreased intake due to constant postprandial pain.  - tTG-IgA pending - Will obtain GI consult, possible EGD/colonoscopy - LR 40ml/h IVF - AM CBC, BMP

## 2023-11-02 NOTE — Anesthesia Preprocedure Evaluation (Signed)
 Anesthesia Evaluation  Patient identified by MRN, date of birth, ID band Patient awake    Reviewed: Allergy & Precautions, NPO status , Patient's Chart, lab work & pertinent test results  Airway Mallampati: II  TM Distance: >3 FB Neck ROM: Full    Dental  (+) Dental Advisory Given, Chipped   Pulmonary neg pulmonary ROS   Pulmonary exam normal breath sounds clear to auscultation       Cardiovascular negative cardio ROS Normal cardiovascular exam Rhythm:Regular Rate:Normal     Neuro/Psych negative neurological ROS     GI/Hepatic Neg liver ROS,,,Periumbilical pain   Endo/Other  negative endocrine ROS    Renal/GU negative Renal ROS     Musculoskeletal negative musculoskeletal ROS (+)    Abdominal   Peds  Hematology negative hematology ROS (+)   Anesthesia Other Findings   Reproductive/Obstetrics                              Anesthesia Physical Anesthesia Plan  ASA: 2  Anesthesia Plan: MAC   Post-op Pain Management: Minimal or no pain anticipated   Induction: Intravenous  PONV Risk Score and Plan: 2 and TIVA and Treatment may vary due to age or medical condition  Airway Management Planned: Natural Airway and Simple Face Mask  Additional Equipment:   Intra-op Plan:   Post-operative Plan:   Informed Consent: I have reviewed the patients History and Physical, chart, labs and discussed the procedure including the risks, benefits and alternatives for the proposed anesthesia with the patient or authorized representative who has indicated his/her understanding and acceptance.     Dental advisory given  Plan Discussed with: CRNA  Anesthesia Plan Comments:          Anesthesia Quick Evaluation

## 2023-11-02 NOTE — Assessment & Plan Note (Deleted)
 Natalie [name]

## 2023-11-03 ENCOUNTER — Encounter (HOSPITAL_COMMUNITY)
Admission: RE | Disposition: A | Discharge status: Home / Self Care | Payer: Self-pay | Source: Ambulatory Visit | Attending: Family Medicine

## 2023-11-03 ENCOUNTER — Encounter (HOSPITAL_COMMUNITY): Payer: Self-pay

## 2023-11-03 ENCOUNTER — Inpatient Hospital Stay (HOSPITAL_COMMUNITY): Payer: Self-pay | Admitting: Anesthesiology

## 2023-11-03 DIAGNOSIS — K2980 Duodenitis without bleeding: Secondary | ICD-10-CM

## 2023-11-03 DIAGNOSIS — R634 Abnormal weight loss: Secondary | ICD-10-CM | POA: Diagnosis not present

## 2023-11-03 DIAGNOSIS — B9681 Helicobacter pylori [H. pylori] as the cause of diseases classified elsewhere: Secondary | ICD-10-CM | POA: Diagnosis not present

## 2023-11-03 DIAGNOSIS — K2950 Unspecified chronic gastritis without bleeding: Secondary | ICD-10-CM | POA: Diagnosis not present

## 2023-11-03 HISTORY — PX: ESOPHAGOGASTRODUODENOSCOPY: SHX5428

## 2023-11-03 LAB — BASIC METABOLIC PANEL WITH GFR
Anion gap: 9 (ref 5–15)
BUN: 17 mg/dL (ref 6–20)
CO2: 23 mmol/L (ref 22–32)
Calcium: 9 mg/dL (ref 8.9–10.3)
Chloride: 103 mmol/L (ref 98–111)
Creatinine, Ser: 0.63 mg/dL (ref 0.44–1.00)
GFR, Estimated: >60 mL/min (ref >60)
Glucose, Bld: 86 mg/dL (ref 70–99)
Potassium: 4 mmol/L (ref 3.5–5.1)
Sodium: 135 mmol/L (ref 135–145)

## 2023-11-03 LAB — PHOSPHORUS: Phosphorus: 4.4 mg/dL (ref 2.5–4.6)

## 2023-11-03 LAB — HEPATITIS B SURFACE ANTIBODY, QUANTITATIVE: Hep B S AB Quant (Post): <3.5 m[IU]/mL — ABNORMAL LOW

## 2023-11-03 LAB — MAGNESIUM: Magnesium: 2.2 mg/dL (ref 1.7–2.4)

## 2023-11-03 LAB — HEPATITIS B CORE ANTIBODY, TOTAL: HEP B CORE AB: NEGATIVE

## 2023-11-03 SURGERY — EGD (ESOPHAGOGASTRODUODENOSCOPY)
Anesthesia: Monitor Anesthesia Care

## 2023-11-03 MED ORDER — ONDANSETRON 4 MG PO TBDP: 4.0000 mg | ORAL_TABLET | Freq: Three times a day (TID) | ORAL | Status: DC | PRN

## 2023-11-03 MED ORDER — PANTOPRAZOLE SODIUM 40 MG PO TBEC
40.0000 mg | DELAYED_RELEASE_TABLET | Freq: Two times a day (BID) | ORAL | Status: DC
  Administered 2023-11-03 – 2023-11-04 (×3): 40 mg via ORAL
  Filled 2023-11-03 (×3): qty 1

## 2023-11-03 MED ORDER — PROPOFOL 10 MG/ML IV BOLUS
INTRAVENOUS | Status: DC | PRN
  Administered 2023-11-03: 30 mg via INTRAVENOUS
  Administered 2023-11-03: 20 mg via INTRAVENOUS
  Administered 2023-11-03: 125 ug/kg/min via INTRAVENOUS
  Administered 2023-11-03 (×2): 20 mg via INTRAVENOUS

## 2023-11-03 MED ORDER — SODIUM CHLORIDE 0.9 % IV SOLN: INTRAVENOUS | Status: DC

## 2023-11-03 MED ORDER — EPHEDRINE SULFATE-NACL 50-0.9 MG/10ML-% IV SOSY
PREFILLED_SYRINGE | INTRAVENOUS | Status: DC | PRN
  Administered 2023-11-03: 5 mg via INTRAVENOUS
  Administered 2023-11-03: 10 mg via INTRAVENOUS

## 2023-11-03 MED ORDER — ONDANSETRON 4 MG PO TBDP
4.0000 mg | ORAL_TABLET | Freq: Four times a day (QID) | ORAL | Status: DC | PRN
  Administered 2023-11-03: 4 mg via ORAL
  Filled 2023-11-03: qty 1

## 2023-11-03 MED ORDER — LIDOCAINE 2% (20 MG/ML) 5 ML SYRINGE
INTRAMUSCULAR | Status: DC | PRN
  Administered 2023-11-03: 30 mg via INTRAVENOUS

## 2023-11-03 NOTE — Plan of Care (Signed)

## 2023-11-03 NOTE — Transfer of Care (Signed)
 Immediate Anesthesia Transfer of Care Note  Patient: Natalie [name]  Procedure(s) Performed: EGD (ESOPHAGOGASTRODUODENOSCOPY)  Patient Location: Endoscopy Unit  Anesthesia Type:MAC  Level of Consciousness: drowsy  Airway & Oxygen Therapy: Patient Spontanous Breathing  Post-op Assessment: Report given to RN and Post -op Vital signs reviewed and stable  Post vital signs: Reviewed and stable  Last Vitals:  Vitals Value Taken Time  BP 97/50 11/03/23 09:10  Temp    Pulse 69 11/03/23 09:14  Resp 12 11/03/23 09:14  SpO2 99 % 11/03/23 09:14  Vitals shown include unfiled device data.  Last Pain:  Vitals:   11/03/23 0906  TempSrc: Temporal  PainSc: Asleep         Complications: No notable events documented.

## 2023-11-03 NOTE — Interval H&P Note (Signed)
 History and Physical Interval Note:  11/03/2023 8:29 AM  Natalie [name]  has presented today for surgery, with the diagnosis of Periumbilical pain, weight loss.  The various methods of treatment have been discussed with the patient and family. After consideration of risks, benefits and other options for treatment, the patient has consented to  Procedure(s): EGD (ESOPHAGOGASTRODUODENOSCOPY) (N/A) as a surgical intervention.  The patient's history has been reviewed, patient examined, no change in status, stable for surgery.  I have reviewed the patient's chart and labs.  Questions were answered to the patient's satisfaction.     Adain Geurin C Keeya Dyckman

## 2023-11-03 NOTE — Progress Notes (Addendum)
 Daily Progress Note Intern Pager: [phone]  Patient name: Natalie [name] Medical record number: [id number] Date of birth: [birth year] Age: 38 y.o. Gender: female  Primary Care Provider: Tharon Lung, MD Consultants: GI Code Status: Full  Pt Overview and Major Events to Date:  Admitted 8/18 EGD 8/20  Assessment and Plan: Natalie [name] is a 38 y.o. female with pmhx/pshx appendectomy, IUD perforation and removal, presenting with unintentional weight loss and postprandial umbilical pain. Workup so far unremarkable. EGD today. Assessment & Plan Weight loss, unintentional  Postprandial abdominal pain Labs and imaging workup so far unremarkable. 13% weight loss in 5 months. March 2025 103 lb>90 lb today. Decreased intake due to constant postprandial pain.  - EGD performed by GI today: awaiting pathology results. Consider CT enterography if EGD inconclusive - Pantoprazole  per GI recs - AM BMP Severe protein-calorie malnutrition (HCC) - Dietician consult, following, ordered supplements - Continue monitoring for refeeding syndrome: phos, mag, potassium - Continue to stay to see if she tolerates PO eating - PT: no PT needs identified - OT: no acute or post PT anticipated Dysphagia No overt symptoms of aspiration. Some globus sensation with eating. Will not restrict diet at this time. - SLP, recs thin liquids, and said sxs appears to be esophageal in nature, recommend GERD precautions and they will briefly f/u for consideration of instrumental testing if testing is inconclusive  FEN/GI: Regular diet PPx: SCD Dispo:Home pending clinical improvement . Barriers include tolerating PO eating and monitoring for refeeding syndrome  Subjective: Patient reports mild 2/10 umbilical abdominal pain and no postprandial pain. States that she has been able to drink and ate a piece of toast without complaint. She does mention mild globus sensation when eating. Patient denies nausea, vomiting,  constipation, diarrhea.  Objective: Temp:  [97.6 F (36.4 C)-98.3 F (36.8 C)] 97.6 F (36.4 C) (08/20 0351) Pulse Rate:  [56-85] 56 (08/20 0351) Resp:  [16-17] 16 (08/20 0351) BP: (83-94)/(49-62) 83/49 (08/20 0351) SpO2:  [97 %-99 %] 98 % (08/20 0351) Physical Exam: General: NAD, resting on bed with eyes closed Cardiovascular: RRR Respiratory: CTAB bilaterally, breathing comfortably in room air Abdomen: TTP in epigastric and umbilical region. Non-distended, soft, no masses Extremities: No deformities  Laboratory: Most recent CBC Lab Results  Component Value Date   WBC 9.9 11/01/2023   HGB 12.6 11/01/2023   HCT 37.5 11/01/2023   MCV 89.1 11/01/2023   PLT 234 11/01/2023   Most recent BMP    Latest Ref Rng & Units 11/03/2023    4:36 AM  BMP  Glucose 70 - 99 mg/dL 86   BUN 6 - 20 mg/dL 17   Creatinine 9.55 - 1.00 mg/dL 9.36   Sodium 864 - 854 mmol/L 135   Potassium 3.5 - 5.1 mmol/L 4.0   Chloride 98 - 111 mmol/L 103   CO2 22 - 32 mmol/L 23   Calcium 8.9 - 10.3 mg/dL 9.0    Other pertinent labs Glucose 85>86 Mag 2.2>2.2 Phos 4.7>4.4 K+ 4.0>4.0  tTG-IgA wnl  Imaging/Diagnostic Tests: EGD Impression: Normal esophagus. Biopsied Gastritis. Biopsied Normal examined duodenum  CT abd/pelv IMPRESSION: No acute findings in the abdomen or pelvis.  Wilburt Gwenn Bernida MARLA, Medical Student 11/03/2023, 7:10 AM  [facility] FPTS Intern pager: [phone], text pages welcome Secure chat group [facility] Teaching Service  I was personally present and performed or re-performed the history, physical exam and medical decision making activities of this service and have verified that  the service and findings are accurately documented in the student's note.  Gladis Church, DO                  11/03/2023, 12:57 PM

## 2023-11-03 NOTE — Anesthesia Postprocedure Evaluation (Signed)
 Anesthesia Post Note  Patient: Kendall Milner  Procedure(s) Performed: EGD (ESOPHAGOGASTRODUODENOSCOPY)     Patient location during evaluation: Endoscopy Anesthesia Type: MAC Level of consciousness: oriented, awake and alert and awake Pain management: pain level controlled Vital Signs Assessment: post-procedure vital signs reviewed and stable Respiratory status: spontaneous breathing, nonlabored ventilation and respiratory function stable Cardiovascular status: blood pressure returned to baseline and stable Postop Assessment: no headache, no backache and no apparent nausea or vomiting Anesthetic complications: no   No notable events documented.  Last Vitals:  Vitals:   11/03/23 0943 11/03/23 1157  BP: (!) 95/46 90/60  Pulse: 65 86  Resp: 16   Temp:  (!) 36.4 C  SpO2: 100% 100%    Last Pain:  Vitals:   11/03/23 1157  TempSrc: Oral  PainSc:                  Garnette FORBES Skillern

## 2023-11-03 NOTE — Assessment & Plan Note (Addendum)
 No overt symptoms of aspiration. Some globus sensation with eating. Will not restrict diet at this time. - SLP, recs thin liquids, and said sxs appears to be esophageal in nature, recommend GERD precautions and they will briefly f/u for consideration of instrumental testing if testing is inconclusive

## 2023-11-03 NOTE — Assessment & Plan Note (Addendum)
-   Dietician consult, following, ordered supplements - Continue monitoring for refeeding syndrome: phos, mag, potassium - Continue to stay to see if she tolerates PO eating - PT: no PT needs identified - OT: no acute or post PT anticipated

## 2023-11-03 NOTE — Assessment & Plan Note (Signed)
>>  ASSESSMENT AND PLAN FOR WEIGHT LOSS, UNINTENTIONAL  POSTPRANDIAL ABDOMINAL PAIN WRITTEN ON 11/03/2023 12:59 PM BY HOWELL LUNGER, DO  Labs and imaging workup so far unremarkable. 13% weight loss in 5 months. March 2025 103 lb>90 lb today. Decreased intake due to constant postprandial pain.  - EGD performed by GI today: awaiting pathology results. Consider CT enterography if EGD inconclusive - Pantoprazole  per GI recs - AM BMP

## 2023-11-03 NOTE — Assessment & Plan Note (Addendum)
 Labs and imaging workup so far unremarkable. 13% weight loss in 5 months. March 2025 103 lb>90 lb today. Decreased intake due to constant postprandial pain.  - EGD performed by GI today: awaiting pathology results. Consider CT enterography if EGD inconclusive - Pantoprazole  per GI recs - AM BMP

## 2023-11-03 NOTE — Op Note (Signed)
 [facility] Patient Name: Natalie [name] Procedure Date : 11/03/2023 MRN: [id number] Attending MD: Rosario Estefana Kidney , , [id number] Date of Birth: [birth year] CSN: [id number] Age: 38 Admit Type: Inpatient Procedure:                Upper GI endoscopy Indications:              Periumbilical abdominal pain, Weight loss Providers:                Rosario Estefana Kidney Hoy jenkins Lebron, RN, Felice Sar, Technician Referring MD:             Rollene CHARLENA Keeling Medicines:                Monitored Anesthesia Care Complications:            No immediate complications. Estimated Blood Loss:     Estimated blood loss was minimal. Procedure:                Pre-Anesthesia Assessment:                           - Prior to the procedure, a History and Physical                            was performed, and patient medications and                            allergies were reviewed. The patient's tolerance of                            previous anesthesia was also reviewed. The risks                            and benefits of the procedure and the sedation                            options and risks were discussed with the patient.                            All questions were answered, and informed consent                            was obtained. Prior Anticoagulants: The patient has                            taken no anticoagulant or antiplatelet agents. ASA                            Grade Assessment: II - A patient with mild systemic                            disease. After reviewing the risks and benefits,  the patient was deemed in satisfactory condition to                            undergo the procedure.                           After obtaining informed consent, the endoscope was                            passed under direct vision. Throughout the                            procedure, the patient's blood pressure, pulse, and                             oxygen saturations were monitored continuously. The                            [id number]-H190 ([id number]) Olympus endoscope was introduced                            through the mouth, and advanced to the second part                            of duodenum. The upper GI endoscopy was                            accomplished without difficulty. The patient                            tolerated the procedure well. Scope In: Scope Out: Findings:      The examined esophagus was normal with perhaps some mild narrowing.       Upper endoscope was able to pass through the esophagus without issues.       Biopsies were taken from the proximal and distal esophagus with cold       forceps for histology.      Localized inflammation characterized by congestion (edema), erosions and       erythema was found in the gastric antrum. Biopsies were taken with a       cold forceps for histology.      The examined duodenum was normal. Impression:               - Normal esophagus. Biopsied.                           - Gastritis. Biopsied.                           - Normal examined duodenum. Recommendation:           - Return patient to hospital ward for ongoing care.                           - Await pathology results.                           -  Will plan for trial of PPI BID.                           - Could consider CT enterography in the future.                           - We will arrange for outpatient GI follow up.                           - The findings and recommendations were discussed                            with the patient. Procedure Code(s):        --- Professional ---                           [phone], Esophagogastroduodenoscopy, flexible,                            transoral; with biopsy, single or multiple Diagnosis Code(s):        --- Professional ---                           K29.70, Gastritis, unspecified, without bleeding                           R10.33,  Periumbilical pain                           R63.4, Abnormal weight loss CPT copyright 2022 American Medical Association. All rights reserved. The codes documented in this report are preliminary and upon coder review may  be revised to meet current compliance requirements. Dr Estefana Federico Rosario Estefana Federico,  11/03/2023 9:12:48 AM Number of Addenda: 0

## 2023-11-04 ENCOUNTER — Other Ambulatory Visit (HOSPITAL_COMMUNITY): Payer: Self-pay

## 2023-11-04 ENCOUNTER — Encounter: Payer: Self-pay | Admitting: Physician Assistant

## 2023-11-04 ENCOUNTER — Ambulatory Visit: Payer: Self-pay | Admitting: Internal Medicine

## 2023-11-04 DIAGNOSIS — R634 Abnormal weight loss: Secondary | ICD-10-CM | POA: Diagnosis not present

## 2023-11-04 DIAGNOSIS — K2950 Unspecified chronic gastritis without bleeding: Secondary | ICD-10-CM | POA: Diagnosis not present

## 2023-11-04 DIAGNOSIS — R627 Adult failure to thrive: Secondary | ICD-10-CM | POA: Diagnosis present

## 2023-11-04 LAB — MAGNESIUM: Magnesium: 2.2 mg/dL (ref 1.7–2.4)

## 2023-11-04 LAB — BASIC METABOLIC PANEL WITH GFR
Anion gap: 11 (ref 5–15)
BUN: 13 mg/dL (ref 6–20)
CO2: 23 mmol/L (ref 22–32)
Calcium: 9.2 mg/dL (ref 8.9–10.3)
Chloride: 105 mmol/L (ref 98–111)
Creatinine, Ser: 0.68 mg/dL (ref 0.44–1.00)
GFR, Estimated: >60 mL/min (ref >60)
Glucose, Bld: 80 mg/dL (ref 70–99)
Potassium: 4 mmol/L (ref 3.5–5.1)
Sodium: 139 mmol/L (ref 135–145)

## 2023-11-04 LAB — PHOSPHORUS: Phosphorus: 4.5 mg/dL (ref 2.5–4.6)

## 2023-11-04 LAB — SURGICAL PATHOLOGY

## 2023-11-04 MED ORDER — BISMUTH/METRONIDAZ/TETRACYCLIN 140-125-125 MG PO CAPS
3.0000 | ORAL_CAPSULE | Freq: Four times a day (QID) | ORAL | 0 refills | Status: AC
  Filled 2023-11-04: qty 120, 10d supply, fill #0

## 2023-11-04 MED ORDER — ONDANSETRON 4 MG PO TBDP
4.0000 mg | ORAL_TABLET | Freq: Four times a day (QID) | ORAL | 0 refills | Status: AC | PRN
  Filled 2023-11-04: qty 20, 5d supply, fill #0

## 2023-11-04 MED ORDER — PANTOPRAZOLE SODIUM 40 MG PO TBEC
40.0000 mg | DELAYED_RELEASE_TABLET | Freq: Two times a day (BID) | ORAL | 0 refills | Status: AC
  Filled 2023-11-04: qty 20, 10d supply, fill #0

## 2023-11-04 NOTE — Assessment & Plan Note (Signed)
>>  ASSESSMENT AND PLAN FOR WEIGHT LOSS, UNINTENTIONAL  POSTPRANDIAL ABDOMINAL PAIN WRITTEN ON 11/04/2023 12:33 PM BY EVERHART, KIRSTIE, DO  13% weight loss in 5 months. March 2025 103 lb>90 lb today. Decreased intake due to constant postprandial pain. Today and yesterday patient tolerated PO eating with minimal pain. - EGD yesterday showed gastritis. 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 - GI recs quadruple H. Pylori therapy: PPI BID, bismuth  subsalicylate 524mg  QID, tetracycline  500mg  QID, and flagyl  250mg  QID for 10 days outpatient.

## 2023-11-04 NOTE — TOC Transition Note (Signed)
 Transition of Care [facility]) - Discharge Note   Patient Details  Name: Natalie [name] MRN: [id number] Date of Birth: Oct 18, 1985  [facility]) CM/SW Contact:  Tom-Johnson, Harvest Muskrat, RN Phone Number: 11/04/2023, 11:23 AM   Clinical Narrative:     Patient is scheduled for discharge today.  Readmission Risk Assessment done. Outpatient f/u, hospital f/u and discharge instructions on AVS. Prescriptions sent to [facility] pharmacy and patient will receive meds prior discharge. No TOC needs or recommendations noted. Husband, Fawn to transport at discharge.  No further TOC needs noted.      Final next level of care: Home/Self Care Barriers to Discharge: Barriers Resolved   Patient Goals and CMS Choice Patient states their goals for this hospitalization and ongoing recovery are:: To return home CMS Medicare.gov Compare Post Acute Care list provided to:: Patient Choice offered to / list presented to : NA      Discharge Placement                Patient to be transferred to facility by: Husband Name of family member notified: [facility]    Discharge Plan and Services Additional resources added to the After Visit Summary for                  DME Arranged: N/A DME Agency: NA       HH Arranged: NA HH Agency: NA        Social Drivers of Health (SDOH) Interventions SDOH Screenings   Food Insecurity: No Food Insecurity (11/01/2023)  Housing: Unknown (11/01/2023)  Transportation Needs: No Transportation Needs (11/01/2023)  Utilities: Not At Risk (11/01/2023)  Depression (PHQ2-9): Medium Risk (05/25/2023)  Tobacco Use: Low Risk  (11/03/2023)     Readmission Risk Interventions    11/04/2023   11:22 AM  Readmission Risk Prevention Plan  Post Dischage Appt Complete  Medication Screening Complete  Transportation Screening Complete

## 2023-11-04 NOTE — Progress Notes (Addendum)
 Daily Progress Note Intern Pager: [phone]  Patient name: Natalie [name] Medical record number: [id number] Date of birth: [birth year] Age: 38 y.o. Gender: female  Primary Care Provider: Tharon Lung, MD Consultants: GI Code Status: Full  Pt Overview and Major Events to Date:  Admitted 8/18 EGD 8/20  Assessment and Plan: Natalie [name] is a 38 y.o. female with pmhx/pshx appendectomy, IUD perforation and removal, presenting with unintentional weight loss and postprandial umbilical pain. Workup prior unremarkable. EGD yesterday showed gastritis and today biopsy results showed H. Pylori chronic gastritis. Patient tolerated PO eating yesterday and today with a variety of foods with minimal umbilical pain and no dysphagia. Anticipate discharge today. Assessment & Plan Weight loss, unintentional  Postprandial abdominal pain 13% weight loss in 5 months. March 2025 103 lb>90 lb today. Decreased intake due to constant postprandial pain. Today and yesterday patient tolerated PO eating with minimal pain. - EGD yesterday showed gastritis. 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 - GI recs quadruple H. Pylori therapy: PPI BID, bismuth  subsalicylate 524mg  QID, tetracycline  500mg  QID, and flagyl  250mg  QID for 10 days outpatient. Severe protein-calorie malnutrition (HCC) - Dietician consult, following, ordered supplements - Continue monitoring for refeeding syndrome: phos, mag, potassium - PT: no PT needs identified - OT: no acute or post PT anticipated Dysphagia No overt symptoms of aspiration. Some globus sensation with eating. Will not restrict diet at this time. Denies current dysphagia. - SLP, recs GERD precautions  FEN/GI: Regular PPx: SCD Dispo:Home today. Barriers include monitoring for refeeding syndrome.  Subjective:  Patient reports eating a variety of food yesterday and states improved appetite as well as umbilical pain. She reports it is no  longer sharp and rated 1-2/10. She is currently having her breakfast. Patient denies dysphagia, nausea, vomiting, diarrhea, constipation, and any other associated symptoms or complaints.  Objective: Temp:  [97.3 F (36.3 C)-98.4 F (36.9 C)] 97.3 F (36.3 C) (08/21 0528) Pulse Rate:  [65-88] 76 (08/21 0528) Resp:  [12-16] 16 (08/20 0943) BP: (88-100)/(46-64) 96/64 (08/21 0528) SpO2:  [98 %-100 %] 99 % (08/21 0528) Weight:  [42 kg] 42 kg (08/20 0805) Physical Exam: General: NAD, thin, well-appearing Cardiovascular: RRR no murmur Respiratory: CTAB, breathing on room air comfortably Abdomen: 1-2/10 TTP in umbilical region. Previous transverse surgical scar in umbilical region. Soft, non-distended, no rashes. Extremities: No deformities  Laboratory: Most recent CBC Lab Results  Component Value Date   WBC 9.9 11/01/2023   HGB 12.6 11/01/2023   HCT 37.5 11/01/2023   MCV 89.1 11/01/2023   PLT 234 11/01/2023   Most recent BMP    Latest Ref Rng & Units 11/04/2023    4:51 AM  BMP  Glucose 70 - 99 mg/dL 80   BUN 6 - 20 mg/dL 13   Creatinine 9.55 - 1.00 mg/dL 9.31   Sodium 864 - 854 mmol/L 139   Potassium 3.5 - 5.1 mmol/L 4.0   Chloride 98 - 111 mmol/L 105   CO2 22 - 32 mmol/L 23   Calcium 8.9 - 10.3 mg/dL 9.2    Other pertinent labs Phos 4.5 Mag 2.2 K+ 4.0  Imaging/Diagnostic Tests: New STOMACH, BIOPSY:       H. pylori associated chronic gastritis with focal activity.       H. pylori identified on HE stain.       Negative for intestinal metaplasia or dysplasia.  DUODENUM, BIOPSY:       Duodenal mucosa with focal foveolar metaplasia and Jolanda  gland  hyperplasia consistent with chronic peptic duodenitis.       No evidence of villous blunting and intraepithelial lymphocytosis.       Negative for dysplasia.   ESOPHAGUS, DISTAL, BIOPSY:       Esophageal squamous mucosa with no significant diagnostic  alteration.      No evidence of intraepithelial eosinophils or  lymphocytes.   ESOPHAGUS, PROXIMAL, BIOPSY:      Esophageal squamous mucosa with no significant diagnostic alteration.      No evidence of intraepithelial eosinophils or lymphocytes.    Previously this encounter EGD Impression: Normal esophagus. Biopsied Gastritis. Biopsied Normal examined duodenum   CT abd/pelv IMPRESSION: No acute findings in the abdomen or pelvis. Wilburt Gwenn Bernida Natalie [name], Medical Student 11/04/2023, 7:13 AM  AI, [facility] FPTS Intern pager: [phone], text pages welcome Secure chat group [facility] Teaching Service  I was personally present and performed or re-performed the history, physical exam and medical decision making activities of this service and have verified that the service and findings are accurately documented in the student's note.  Lisha Vitale, DO                  11/04/2023, 12:31 PM

## 2023-11-04 NOTE — Progress Notes (Signed)
 Brief GI Path Note: Biopsies from EGD came back with H pylori gastritis, which may be the reason for her periumbilical ab pain. I recommended bismuth  quadruple therapy as follows:  - Tetracycline  500 mg QID x 14 days - Flagyl  250 mg QID x 14 days - Bismuth  subsalicylate 524 mg QID x 14 days - PPI BID x 14 days  Hospital team was notified and will initiate her on therapy prior to discharge today. Follow up appt has already been arranged for 12/15/23 with our clinic.  Estefana Kidney, MD

## 2023-11-04 NOTE — Assessment & Plan Note (Signed)
 No overt symptoms of aspiration. Some globus sensation with eating. Will not restrict diet at this time. Denies current dysphagia. - SLP, recs GERD precautions

## 2023-11-04 NOTE — Assessment & Plan Note (Signed)
 13% weight loss in 5 months. March 2025 103 lb>90 lb today. Decreased intake due to constant postprandial pain. Today and yesterday patient tolerated PO eating with minimal pain. - EGD yesterday showed gastritis. 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 - GI recs quadruple H. Pylori therapy: PPI BID, bismuth  subsalicylate 524mg  QID, tetracycline  500mg  QID, and flagyl  250mg  QID for 10 days outpatient.

## 2023-11-04 NOTE — Discharge Summary (Addendum)
 Family Medicine Teaching [facility] Discharge Summary  Patient name: Tymeka Privette Medical record number: [id number] Date of birth: 06-16-1985 Age: 38 y.o. Gender: female Date of Admission: 11/01/2023  Date of Discharge: 11/04/2023 Admitting Physician: Alfornia Light, DO  Primary Care Provider: Tharon Lung, MD Consultants: GI  Indication for Hospitalization: Unintentional weight loss and umbilical pain  Discharge Diagnoses/Problem List:  Principal Problem for Admission: Unintentional weight loss Other Problems addressed during stay:  Principal Problem:   Weight loss, unintentional  Postprandial abdominal pain Active Problems:   Severe protein-calorie malnutrition (HCC)   Generalized postprandial abdominal pain   Unintentional weight loss   Dysphagia   Failure to thrive in adult  The above problem list has been updated and reviewed for accuracy, including the initial reason for admission.  Brief Hospital Course:  Kirk Sampley is a 38 y.o.female with a history of appendectomy who was admitted to the [facility] Medicine Teaching Service at [facility] for unintentional weight loss and periumbilical pain. She is a direct admit from [facility]. Her hospital course is detailed below:  Weight loss, unintentional I Generalized postprandial abdominal pain Patient was 106lbs in October 2024 and presented 90lbs. She lost 15% in the past 5 months secondary to poor po intake due to postprandial pain. CT abd/pelvis showed no acute process. Lab workup was overall unremarkable. 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 Patient placed on quadruple H. Pylori therapy to be completed outpatient on a 10 day course. Patient improved and tolerated PO eating and consumed a variety of calories without dysphagia and with improved abdominal pain 1-2/10 that is no longer sharp.  Severe protein-calorie malnutrition Dietician consulted  and recommended Ensure supplement TID, MVI and thiamine  daily, and continuing regular diet. Monitored for refeeding syndrome. Patient was evaluated by PT/OT and no acute needs were identified. SLP evaluated patient and reported functional oropharyngeal swallow, and recommended GERD precautions.  PCP Follow-up Recommendations: Diet plan 2.   Non-immune to Hep B recommend vaccine 3.   H. Pylori eradication test after 4 weeks of therapy completion  Results/Tests Pending at Time of Discharge:  Unresulted Labs (From admission, onward)    None        Disposition: Home  Discharge Condition: Stable  Discharge Exam:  Vitals:   11/03/23 2127 11/04/23 0528  BP: 92/64 96/64  Pulse: 85 76  Resp:    Temp: (!) 97.5 F (36.4 C) (!) 97.3 F (36.3 C)  SpO2: 98% 99%   Physical Exam: General: NAD, thin, well-appearing Cardiovascular: RRR no murmur Respiratory: CTAB, breathing on room air comfortably Abdomen: 1-2/10 TTP in umbilical region. Previous transverse surgical scar in umbilical region. Soft, non-distended, no rashes. Extremities: No deformities  Significant Procedures: EGD 8/20 Impression: Normal esophagus. Biopsied Gastritis. Biopsied Normal examined duodenum  Significant Labs and Imaging:  No results for input(s): WBC, HGB, HCT, PLT in the last 48 hours. Recent Labs  Lab 11/03/23 0436 11/04/23 0451  NA 135 139  K 4.0 4.0  CL 103 105  CO2 23 23  GLUCOSE 86 80  BUN 17 13  CREATININE 0.63 0.68  CALCIUM 9.0 9.2  MG 2.2 2.2  PHOS 4.4 4.5   Pertinent Imaging CT ABDOMEN AND PELVIS WITH CONTRAST  IMPRESSION: No acute findings in the abdomen or pelvis.    Discharge Medications:  Allergies as of 11/04/2023       Reactions   Beef-derived Drug Products    Pork-derived Products  Medication List     TAKE these medications    bismuth -metronidazole -tetracycline  140-125-125 MG capsule Commonly known as: Pylera  Take 3 capsule by mouth 4 (four)  times daily as directed on package for 10 days.   ondansetron  4 MG disintegrating tablet Commonly known as: ZOFRAN -ODT Take 1 tablet (4 mg total) by mouth every 6 (six) hours as needed for nausea or vomiting.   pantoprazole  40 MG tablet Commonly known as: PROTONIX  Take 1 tablet (40 mg total) by mouth 2 (two) times daily for 10 days.       Discharge Instructions: Please refer to Patient Instructions section of EMR for full details.  Patient was counseled important signs and symptoms that should prompt return to medical care, changes in medications, dietary instructions, activity restrictions, and follow up appointments.   Follow-Up Appointments:   Wilburt Gwenn Bernida MARLA, Medical Student 11/04/2023, 12:02 PM AI, [facility]  I was personally present and performed or re-performed the history, physical exam and medical decision making activities of this service and have verified that the service and findings are accurately documented in the student's note.  Gladis Church, DO                  11/04/2023, 12:34 PM

## 2023-11-04 NOTE — Plan of Care (Signed)

## 2023-11-04 NOTE — Progress Notes (Signed)
 SLP Cancellation Note  Patient Details Name: Felissa Blouch MRN: [id number] DOB: [birth year]   Cancelled treatment:       Reason Eval/Treat Not Completed: Other (comment). No need for further SLP interventions given medical management of problem by GI. Will sign off.    Travares Nelles, Consuelo Fitch 11/04/2023, 11:31 AM

## 2023-11-04 NOTE — Assessment & Plan Note (Signed)
-   Dietician consult, following, ordered supplements - Continue monitoring for refeeding syndrome: phos, mag, potassium - PT: no PT needs identified - OT: no acute or post PT anticipated

## 2023-11-12 ENCOUNTER — Encounter: Payer: Self-pay | Admitting: Family Medicine

## 2023-11-12 ENCOUNTER — Ambulatory Visit (INDEPENDENT_AMBULATORY_CARE_PROVIDER_SITE_OTHER): Payer: Self-pay | Admitting: Family Medicine

## 2023-11-12 VITALS — BP 96/61 | HR 74 | Ht 60.0 in | Wt 92.5 lb

## 2023-11-12 DIAGNOSIS — R627 Adult failure to thrive: Secondary | ICD-10-CM

## 2023-11-12 DIAGNOSIS — Z0000 Encounter for general adult medical examination without abnormal findings: Secondary | ICD-10-CM

## 2023-11-12 NOTE — Progress Notes (Signed)
    SUBJECTIVE:   CHIEF COMPLAINT / HPI:   Hospital follow up Feeling much better since coming out of the hospital. She was admitted for severe protein calorie malnutrition in the setting of unintentional weight loss and generalized postprandial abdominal pain. She was positive for H pylori on endoscopy with chronic gastritis and has been on treatment. She has also been taking her protonix . She has not needed her Zofran . She has a follow up scheduled with GI already.  PERTINENT  PMH / PSH: Ruptured appendicitis  OBJECTIVE:   BP 96/61   Pulse 74   Ht 5' (1.524 m)   Wt 92 lb 8 oz (42 kg)   LMP 10/14/2023   SpO2 100%   BMI 18.07 kg/m   General: Alert and oriented, in NAD Skin: Warm, dry, and intact HEENT: NCAT, EOM grossly normal, midline nasal septum Cardiac: RRR, no m/r/g appreciated Respiratory: CTAB, breathing and speaking comfortably on RA Abdominal: Soft, nontender, nondistended, normoactive bowel sounds Extremities: Moves all extremities grossly equally Neurological: No gross focal deficit Psychiatric: Appropriate mood and affect   ASSESSMENT/PLAN:   Assessment & Plan Failure to thrive in adult Reassuringly improved since hospitalization with weight gain.  Labs over admission and at discharge were negative for refeeding syndrome.  She is continuing on her treatment for H. pylori.  We discussed she will need to come back in 4 weeks for test of cure.  She was instructed to stop the Protonix  2 weeks before her test to ensure the best accuracy.  I have also referred her to a nutritionist for further nutritional education.  She also has a follow-up already scheduled with GI. Healthcare maintenance At her next visit in 4 weeks, we will perform her Pap smear.  She declines vaccinations at this time.  Stuart Redo, MD [facility]

## 2023-11-12 NOTE — Patient Instructions (Signed)
 Continue your Pylera  medication until you are done with the course. You will need to come back in 1 month after finishing this to have a test of cure. Be sure to also STOP the protonix  at least 2 weeks before your appointment to have the most accurate results. I have sent in a referral to the nutritionist as well. When you come back, we can do your pap smear if you would like as well as talk about other vaccines.

## 2023-11-12 NOTE — Assessment & Plan Note (Addendum)
 Reassuringly improved since hospitalization with weight gain.  Labs over admission and at discharge were negative for refeeding syndrome.  She is continuing on her treatment for H. pylori.  We discussed she will need to come back in 4 weeks for test of cure.  She was instructed to stop the Protonix  2 weeks before her test to ensure the best accuracy.  I have also referred her to a nutritionist for further nutritional education.  She also has a follow-up already scheduled with GI.

## 2023-11-12 NOTE — Assessment & Plan Note (Signed)
>>  ASSESSMENT AND PLAN FOR FAILURE TO THRIVE IN ADULT WRITTEN ON 11/12/2023 10:57 AM BY Kevontay Burks, MD  Reassuringly improved since hospitalization with weight gain.  Labs at discharge were negative for refeeding syndrome.  She is continuing on her treatment for H. pylori.  We discussed she will need to come back in 4 weeks for test of cure.  She was instructed to stop the Protonix  2 weeks before her test to ensure the best accuracy.  I have also referred her to a nutritionist for further nutritional education.  She also has a follow-up already scheduled with GI.

## 2023-12-13 ENCOUNTER — Encounter: Payer: Self-pay | Admitting: Family Medicine

## 2023-12-13 ENCOUNTER — Ambulatory Visit (INDEPENDENT_AMBULATORY_CARE_PROVIDER_SITE_OTHER): Admitting: Family Medicine

## 2023-12-13 VITALS — BP 90/62 | HR 86 | Ht 62.0 in | Wt 90.8 lb

## 2023-12-13 DIAGNOSIS — R634 Abnormal weight loss: Secondary | ICD-10-CM

## 2023-12-13 DIAGNOSIS — A048 Other specified bacterial intestinal infections: Secondary | ICD-10-CM | POA: Insufficient documentation

## 2023-12-13 NOTE — Patient Instructions (Addendum)
 It was wonderful to see you today.  Please bring ALL of your medications with you to every visit.   Today we talked about:  Weight loss - I am glad you have your appetite back. Your weight should start to increase as you are eating more. I am glad you are seeing the nutritionist tomorrow. They can give you further recommendations. We can measure your weight again in a month. More reassuring is your energy levels and appetite  Gastritis - We will check if you have h pylori infection still. If you do we will give you a more prolonged course, if not you can stop the protonix  give you are not having abdominal pain while off of it   I do recommend you get your pap smear at your follow up appt.   Please follow up in 1 month  Thank you for choosing [facility].   Please call [phone] with any questions about today's appointment.  Please be sure to schedule follow up at the front desk before you leave today.   Areta Saliva, MD  Family Medicine

## 2023-12-13 NOTE — Assessment & Plan Note (Signed)
 Patient 90 pounds from 92 pounds at last visit 1 month ago.  Reassuring that she has increased appetite and improved energy decreased weight could be due to calibration of scale or other normal fluctuations.  Other signs more reassuring. -BMP, magnesium, phosphorus today given patient has increased her intake to order for refeeding syndrome. - Patient has appointment with dietitian tomorrow.  Can discuss how to improve diet to assist with weight gain - Patient has follow-up with GI in 2 days

## 2023-12-13 NOTE — Assessment & Plan Note (Signed)
 Patient completed treatment.  Reassuring that she no longer has any gastritis or abdominal pain type symptoms especially even while not taking Protonix  for the last 2 weeks. - H. pylori test of cure - Can stop Protonix  given no gastritis type symptoms. - Follow-up with GI in 2 days

## 2023-12-13 NOTE — Progress Notes (Signed)
    SUBJECTIVE:   CHIEF COMPLAINT / HPI:   Unintentional weight loss/ Abdominal Pain  Patient has been feeling even better since last visit. She says she has been feeling more hungry since last time. She used to not have appetite. Now she says she is eating more. She denies abdominal pain with eating. No abdominal pain otherwise. She also mentions having more energy.  She is confused that her weight hasn't increased No nausea. No dizziness or lightheadedness.   PERTINENT  PMH / PSH: History of chronic gastritis, H. pylori  OBJECTIVE:   BP 90/62   Pulse 86   Ht 5' 2 (1.575 m)   Wt 90 lb 12.8 oz (41.2 kg)   SpO2 99%   BMI 16.61 kg/m   General: Well-appearing, thin CV: Murmurs rubs or gallops cap refill less than 2 seconds, radial pulses Resp: Normal work of breathing on room air Abdomen: Thin, nontender to palpation, nondistended  ASSESSMENT/PLAN:   Assessment & Plan H. pylori infection Patient completed treatment.  Reassuring that she no longer has any gastritis or abdominal pain type symptoms especially even while not taking Protonix  for the last 2 weeks. - H. pylori test of cure - Can stop Protonix  given no gastritis type symptoms. - Follow-up with GI in 2 days Unintentional weight loss Patient 90 pounds from 92 pounds at last visit 1 month ago.  Reassuring that she has increased appetite and improved energy decreased weight could be due to calibration of scale or other normal fluctuations.  Other signs more reassuring. -BMP, magnesium, phosphorus today given patient has increased her intake to order for refeeding syndrome. - Patient has appointment with dietitian tomorrow.  Can discuss how to improve diet to assist with weight gain - Patient has follow-up with GI in 2 days   Follow-up in 1 month to follow-up weight gain  Areta Saliva, MD [facility]

## 2023-12-14 ENCOUNTER — Encounter: Payer: Self-pay | Attending: Family Medicine | Admitting: Dietician

## 2023-12-14 ENCOUNTER — Encounter: Payer: Self-pay | Admitting: Dietician

## 2023-12-14 ENCOUNTER — Ambulatory Visit: Payer: Self-pay | Admitting: Family Medicine

## 2023-12-14 VITALS — Wt 91.7 lb

## 2023-12-14 DIAGNOSIS — R627 Adult failure to thrive: Secondary | ICD-10-CM | POA: Diagnosis present

## 2023-12-14 LAB — BASIC METABOLIC PANEL WITH GFR
BUN/Creatinine Ratio: 30 — ABNORMAL HIGH (ref 9–23)
BUN: 18 mg/dL (ref 6–20)
CO2: 22 mmol/L (ref 20–29)
Calcium: 9.6 mg/dL (ref 8.7–10.2)
Chloride: 102 mmol/L (ref 96–106)
Creatinine, Ser: 0.6 mg/dL (ref 0.57–1.00)
Glucose: 79 mg/dL (ref 70–99)
Potassium: 3.9 mmol/L (ref 3.5–5.2)
Sodium: 140 mmol/L (ref 134–144)
eGFR: 118 mL/min/1.73 (ref >59)

## 2023-12-14 LAB — MAGNESIUM: Magnesium: 2.2 mg/dL (ref 1.6–2.3)

## 2023-12-14 LAB — PHOSPHORUS: Phosphorus: 3.9 mg/dL (ref 3.0–4.3)

## 2023-12-14 NOTE — Progress Notes (Signed)
 Medical Nutrition Therapy  Appointment Start time:  1515  Appointment End time:  1545  Primary concerns today: weight restoration   Referral diagnosis: failure to thrive in adult Preferred learning style: no preference indicated Learning readiness: ready   NUTRITION ASSESSMENT   Anthropometrics   Wt 12/14/23: 91.7 lb  Clinical Medical Hx: reviewed Medications: reviewed Labs: reviewed Notable Signs/Symptoms: none Food Allergies: none  Lifestyle & Dietary Hx  Pt reports a few months ago she lost her appetite and was experiencing so much stomach pain that she basically stopped eating. Pt was then hospitalized for H pylori.   Pt reports prior to this she was typically between 102-106 lb throughout her adult life. Pt reports she has always been smaller, and has always eaten lightly.   Pt reports since then, her appetite has came back, she is more hungry, and she is feeling more energized.   Pt reports typically eating 3 times per day. Pt states she does not typically have snacks. Pt reports drinking 1 water bottle daily.   Pt states she has 3 daughters, ages 53, 31 and 12.    Pt reports she is picky, with accepted vegetables being salad, broccoli, cucumber, carrot, and tomato.   Estimated daily fluid intake: 16 oz Supplements: none Sleep: 6-8 hours Stress / self-care: low-to-moderate stress Current average weekly physical activity: ADLs  24-Hr Dietary Recall First Meal: 8:30am: fruit and coffee OR egg, cheese, bread Snack: none Second Meal: taco OR potato and meat Snack: none Third Meal: rice and chicken Snack: none Beverages: coffee, water, orange juice occasionally   NUTRITION DIAGNOSIS  [place]-3.2 Unintentional weight loss As related to H pylori.  As evidenced by hospitalization for H pylori, weight loss of 13% over 5 months .   NUTRITION INTERVENTION  Nutrition education (E-1) on the following topics:   Unintentional Weight loss A high-calorie, high-protein diet  has been recommended to you. Your registered dietitian nutritionist (RDN) may have recommended this diet because you are having difficulty eating enough calories throughout the day, you have lost weight, and/or you need to add protein to your diet.  Sometimes you may not feel like eating, even if you know the importance of good nutrition. The recommendations can help you with the following: Regaining your strength and energy Keeping your body healthy Healing and recovering from surgery or illness and fighting infection  Schedule Your Meals and Snacks  Several small meals and snacks are often better tolerated and digested than large meals.  Strategies Plan to eat 3 meals and 3 snacks daily. Experiment with timing meals to find out when you have a larger appetite. Appetite may be greatest in the morning after not eating all night so you may prefer to eat your larger meals and snacks in the morning and at lunch. Breakfast-type foods are often better tolerated so eat foods such as eggs, pancakes, waffles and cereal for any meal or snack. Carry snacks with you so you are prepared to eat every 2 to 3 hours. Determine what works best for you if your body's cues for feeling hungry or full are not working. Eat a small meal or snack even if you don't feel hungry. Set a timer to remind you when it is time to eat. Take a walk before you eat (with health care provider's approval). Light or moderate physical activity can help you maintain muscle and increase your appetite.  Handouts Provided Include  Build a Healthy Smoothie Plate Method  Learning Style & Readiness for Change  Teaching method utilized: Visual & Auditory  Demonstrated degree of understanding via: Teach Back  Barriers to learning/adherence to lifestyle change: none  Goals Established by Pt  Get vitamin D  tested.  Aim to follow this schedule:  -Breakfast -Snack (spaced 2 hours between) -Lunch -Snack (spaced 2 hours  between) -[employer] a more balanced breakfast. (Examples: smoothie, eggs and bread, eggs and fruit).  Aim for 2 bottles of water daily.    MONITORING & EVALUATION Dietary intake, weekly physical activity, and follow up as needed.  Next Steps  Patient is to call for questions.

## 2023-12-14 NOTE — Patient Instructions (Signed)
 Goals Established by Pt  Get vitamin D  tested.  Aim to follow this schedule:  -Breakfast -Snack (spaced 2 hours between) -Lunch -Snack (spaced 2 hours between) -[employer] a more balanced breakfast. (Examples: smoothie, eggs and bread, eggs and fruit).  Aim for 2 bottles of water daily.

## 2023-12-14 NOTE — Progress Notes (Deleted)
 12/14/2023 Natalie [name] [id number] August 20, 1985  Referring provider: Tharon Lung, MD Primary GI doctor: Dr. Federico  ASSESSMENT AND PLAN:  Weight loss, periumbilical abdominal pain 11/01/2023 negative celiac, sed rate CRP, thyroid   H. pylori gastritis 11/03/2023 EGD Dr. Federico showed normal esophagus, gastritis, duodenum, Path H pylori Started on Quad therapy: tetracycline , Flagyl  bismuth  and PPI  History of ruptured appendix  11/01/2023 CTAP W unremarkable  Patient Care Team: Tharon Lung, MD as PCP - General (Family Medicine)  HISTORY OF PRESENT ILLNESS: 38 y.o. female with a past medical history listed below presents for evaluation of ***.   Patient was in the hospital 8/18 through 8/21 for periumbilical pain and weight loss.  11/01/2023 CTAP W unremarkable.11/03/2023 EGD Dr. Federico showed normal esophagus, gastritis, duodenum, pathology came back with H. pylori and she was started on tetracycline , Flagyl  bismuth  and PPI  *** Discussed the use of AI scribe software for clinical note transcription with the patient, who gave verbal consent to proceed.  History of Present Illness            She  reports that she has never smoked. She has never used smokeless tobacco. She reports that she does not drink alcohol and does not use drugs.  RELEVANT GI HISTORY, IMAGING AND LABS: Results          CBC    Component Value Date/Time   WBC 9.9 11/01/2023 1808   RBC 4.21 11/01/2023 1808   HGB 12.6 11/01/2023 1808   HGB 12.1 12/11/2022 1638   HCT 37.5 11/01/2023 1808   HCT 37.4 12/11/2022 1638   PLT 234 11/01/2023 1808   PLT 175 12/11/2022 1638   MCV 89.1 11/01/2023 1808   MCV 93 12/11/2022 1638   MCH 29.9 11/01/2023 1808   MCHC 33.6 11/01/2023 1808   RDW 12.0 11/01/2023 1808   RDW 12.1 12/11/2022 1638   LYMPHSABS 2.8 11/01/2023 1808   LYMPHSABS 2.1 12/11/2022 1638   MONOABS 0.6 11/01/2023 1808   EOSABS 0.0 11/01/2023 1808   EOSABS 0.1 12/11/2022 1638   BASOSABS  0.0 11/01/2023 1808   BASOSABS 0.0 12/11/2022 1638   Recent Labs    11/01/23 1808  HGB 12.6    CMP     Component Value Date/Time   NA 139 11/04/2023 0451   NA 138 02/14/2021 1550   K 4.0 11/04/2023 0451   CL 105 11/04/2023 0451   CO2 23 11/04/2023 0451   GLUCOSE 80 11/04/2023 0451   BUN 13 11/04/2023 0451   BUN 20 02/14/2021 1550   CREATININE 0.68 11/04/2023 0451   CALCIUM 9.2 11/04/2023 0451   PROT 7.1 11/02/2023 0539   ALBUMIN 3.7 11/02/2023 0539   AST 15 11/02/2023 0539   ALT 16 11/02/2023 0539   ALKPHOS 34 (L) 11/02/2023 0539   BILITOT 0.9 11/02/2023 0539   GFRNONAA >60 11/04/2023 0451   GFRAA 139 04/05/2018 0953      Latest Ref Rng & Units 11/02/2023    5:39 AM 11/01/2023    6:08 PM 04/08/2020    2:03 PM  Hepatic Function  Total Protein 6.5 - 8.1 g/dL 7.1  8.0  8.3   Albumin 3.5 - 5.0 g/dL 3.7  4.2  3.4   AST 15 - 41 U/L 15  19  13    ALT 0 - 44 U/L 16  18  17    Alk Phosphatase 38 - 126 U/L 34  39  65   Total Bilirubin 0.0 - 1.2 mg/dL 0.9  0.7  0.6       Current Medications:        Current Outpatient Medications (Other):    Bismuth /Metronidaz/Tetracyclin (PYLERA ) 140-125-125 MG CAPS, Take 3 capsule by mouth 4 (four) times daily as directed on package for 10 days.   ondansetron  (ZOFRAN -ODT) 4 MG disintegrating tablet, Take 1 tablet (4 mg total) by mouth every 6 (six) hours as needed for nausea or vomiting.   pantoprazole  (PROTONIX ) 40 MG tablet, Take 1 tablet (40 mg total) by mouth 2 (two) times daily for 10 days.  Medical History:  Past Medical History:  Diagnosis Date   Acute cystitis with hematuria 05/11/2022   Dysphagia 11/01/2023   Encounter for wellness examination in adult 11/15/2019   Epistaxis 04/15/2018   Folliculitis 12/08/2022   Generalized postprandial abdominal pain 11/01/2023   Lymphadenopathy 12/13/2022   Normal pregnancy, repeat 07/01/2011   Pain of left lower extremity 02/14/2021   Pleuritic chest pain 02/14/2021   S/P  laparoscopy 02/20/2015   S/P laparoscopy 02/20/2015   SVD (spontaneous vaginal delivery) 07/02/2011   SVD (spontaneous vaginal delivery) 09/10/2016   Allergies:  Allergies  Allergen Reactions   Beef-Derived Drug Products    Pork-Derived Products      Surgical History:  She  has a past surgical history that includes No past surgeries; Hysteroscopy (N/A, 01/07/2015); laparoscopy (N/A, 02/20/2015); IUD removal (N/A, 02/20/2015); IR Radiologist Eval & Mgmt (04/25/2020); laparoscopic appendectomy (N/A, 05/28/2020); and Esophagogastroduodenoscopy (N/A, 11/03/2023). Family History:  Her family history is not on file.  REVIEW OF SYSTEMS  : All other systems reviewed and negative except where noted in the History of Present Illness.  PHYSICAL EXAM: There were no vitals taken for this visit. Physical Exam          Alan JONELLE Coombs, PA-C 7:44 AM

## 2023-12-15 ENCOUNTER — Ambulatory Visit: Admitting: Physician Assistant

## 2023-12-15 LAB — H. PYLORI BREATH TEST: H pylori Breath Test: NEGATIVE

## 2024-01-25 NOTE — Progress Notes (Deleted)
 01/25/2024 Natalie [name] [id number] 06/05/1985  Referring provider: Tharon Lung, MD Primary GI doctor: {[id number]}  ASSESSMENT AND PLAN:   Patient Care Team: Tharon Lung, MD as PCP - General (Family Medicine)  HISTORY OF PRESENT ILLNESS: 38 y.o. female with a past medical history listed below presents for evaluation of ***.   Patient had hospital mission 8/18 through 8/21  *** Discussed the use of AI scribe software for clinical note transcription with the patient, who gave verbal consent to proceed.  History of Present Illness            She  reports that she has never smoked. She has never used smokeless tobacco. She reports that she does not drink alcohol and does not use drugs.  RELEVANT GI HISTORY, IMAGING AND LABS: Results          CBC    Component Value Date/Time   WBC 9.9 11/01/2023 1808   RBC 4.21 11/01/2023 1808   HGB 12.6 11/01/2023 1808   HGB 12.1 12/11/2022 1638   HCT 37.5 11/01/2023 1808   HCT 37.4 12/11/2022 1638   PLT 234 11/01/2023 1808   PLT 175 12/11/2022 1638   MCV 89.1 11/01/2023 1808   MCV 93 12/11/2022 1638   MCH 29.9 11/01/2023 1808   MCHC 33.6 11/01/2023 1808   RDW 12.0 11/01/2023 1808   RDW 12.1 12/11/2022 1638   LYMPHSABS 2.8 11/01/2023 1808   LYMPHSABS 2.1 12/11/2022 1638   MONOABS 0.6 11/01/2023 1808   EOSABS 0.0 11/01/2023 1808   EOSABS 0.1 12/11/2022 1638   BASOSABS 0.0 11/01/2023 1808   BASOSABS 0.0 12/11/2022 1638   Recent Labs    11/01/23 1808  HGB 12.6    CMP     Component Value Date/Time   NA 140 12/13/2023 1412   K 3.9 12/13/2023 1412   CL 102 12/13/2023 1412   CO2 22 12/13/2023 1412   GLUCOSE 79 12/13/2023 1412   GLUCOSE 80 11/04/2023 0451   BUN 18 12/13/2023 1412   CREATININE 0.60 12/13/2023 1412   CALCIUM 9.6 12/13/2023 1412   PROT 7.1 11/02/2023 0539   ALBUMIN 3.7 11/02/2023 0539   AST 15 11/02/2023 0539   ALT 16 11/02/2023 0539   ALKPHOS 34 (L) 11/02/2023 0539   BILITOT 0.9  11/02/2023 0539   GFRNONAA >60 11/04/2023 0451   GFRAA 139 04/05/2018 0953      Latest Ref Rng & Units 11/02/2023    5:39 AM 11/01/2023    6:08 PM 04/08/2020    2:03 PM  Hepatic Function  Total Protein 6.5 - 8.1 g/dL 7.1  8.0  8.3   Albumin 3.5 - 5.0 g/dL 3.7  4.2  3.4   AST 15 - 41 U/L 15  19  13    ALT 0 - 44 U/L 16  18  17    Alk Phosphatase 38 - 126 U/L 34  39  65   Total Bilirubin 0.0 - 1.2 mg/dL 0.9  0.7  0.6       Current Medications:        Current Outpatient Medications (Other):    Bismuth /Metronidaz/Tetracyclin (PYLERA ) 140-125-125 MG CAPS, Take 3 capsule by mouth 4 (four) times daily as directed on package for 10 days.   ondansetron  (ZOFRAN -ODT) 4 MG disintegrating tablet, Take 1 tablet (4 mg total) by mouth every 6 (six) hours as needed for nausea or vomiting.   pantoprazole  (PROTONIX ) 40 MG tablet, Take 1 tablet (40 mg total) by mouth 2 (two) times daily  for 10 days.  Medical History:  Past Medical History:  Diagnosis Date   Acute cystitis with hematuria 05/11/2022   Dysphagia 11/01/2023   Encounter for wellness examination in adult 11/15/2019   Epistaxis 04/15/2018   Folliculitis 12/08/2022   Generalized postprandial abdominal pain 11/01/2023   Lymphadenopathy 12/13/2022   Normal pregnancy, repeat 07/01/2011   Pain of left lower extremity 02/14/2021   Pleuritic chest pain 02/14/2021   S/P laparoscopy 02/20/2015   S/P laparoscopy 02/20/2015   SVD (spontaneous vaginal delivery) 07/02/2011   SVD (spontaneous vaginal delivery) 09/10/2016   Allergies:  Allergies  Allergen Reactions   Bovine (Beef) Protein-Containing Drug Products    Porcine (Pork) Protein-Containing Drug Products      Surgical History:  She  has a past surgical history that includes No past surgeries; Hysteroscopy (N/A, 01/07/2015); laparoscopy (N/A, 02/20/2015); IUD removal (N/A, 02/20/2015); IR Radiologist Eval & Mgmt (04/25/2020); laparoscopic appendectomy (N/A, 05/28/2020); and  Esophagogastroduodenoscopy (N/A, 11/03/2023). Family History:  Her family history is not on file.  REVIEW OF SYSTEMS  : All other systems reviewed and negative except where noted in the History of Present Illness.  PHYSICAL EXAM: There were no vitals taken for this visit. Physical Exam          Alan JONELLE Coombs, PA-C 1:16 PM

## 2024-01-26 ENCOUNTER — Ambulatory Visit: Payer: Self-pay | Admitting: Physician Assistant
